# Patient Record
Sex: Female | Born: 1955
Health system: Southern US, Community
[De-identification: ages and names within clinical notes are randomized; demographics above are authoritative.]

## PROBLEM LIST (undated history)

## (undated) DIAGNOSIS — K635 Polyp of colon: Secondary | ICD-10-CM

## (undated) DIAGNOSIS — E079 Disorder of thyroid, unspecified: Secondary | ICD-10-CM

## (undated) DIAGNOSIS — E785 Hyperlipidemia, unspecified: Secondary | ICD-10-CM

## (undated) DIAGNOSIS — R42 Dizziness and giddiness: Secondary | ICD-10-CM

## (undated) DIAGNOSIS — I499 Cardiac arrhythmia, unspecified: Secondary | ICD-10-CM

## (undated) DIAGNOSIS — E039 Hypothyroidism, unspecified: Secondary | ICD-10-CM

## (undated) DIAGNOSIS — R7303 Prediabetes: Secondary | ICD-10-CM

## (undated) HISTORY — DX: Prediabetes: R73.03

## (undated) HISTORY — DX: Polyp of colon: K63.5

## (undated) HISTORY — DX: Hyperlipidemia, unspecified: E78.5

## (undated) HISTORY — DX: Disorder of thyroid, unspecified: E07.9

## (undated) HISTORY — DX: Dizziness and giddiness: R42

## (undated) HISTORY — PX: ABDOMINAL HYSTERECTOMY: SHX81

---

## 2002-01-01 ENCOUNTER — Other Ambulatory Visit: Admission: RE | Admit: 2002-01-01 | Discharge: 2002-01-01 | Payer: Self-pay | Admitting: Obstetrics and Gynecology

## 2003-01-21 ENCOUNTER — Ambulatory Visit (HOSPITAL_BASED_OUTPATIENT_CLINIC_OR_DEPARTMENT_OTHER): Admission: RE | Admit: 2003-01-21 | Discharge: 2003-01-21 | Payer: Self-pay | Admitting: Plastic Surgery

## 2003-02-18 ENCOUNTER — Other Ambulatory Visit: Admission: RE | Admit: 2003-02-18 | Discharge: 2003-02-18 | Payer: Self-pay | Admitting: Obstetrics and Gynecology

## 2008-11-19 LAB — HM COLONOSCOPY: HM COLON: NORMAL

## 2012-06-11 ENCOUNTER — Other Ambulatory Visit: Payer: Self-pay | Admitting: Family Medicine

## 2012-06-11 DIAGNOSIS — R519 Headache, unspecified: Secondary | ICD-10-CM

## 2012-06-11 DIAGNOSIS — R42 Dizziness and giddiness: Secondary | ICD-10-CM

## 2012-06-13 ENCOUNTER — Other Ambulatory Visit: Payer: Self-pay

## 2012-06-18 ENCOUNTER — Ambulatory Visit
Admission: RE | Admit: 2012-06-18 | Discharge: 2012-06-18 | Disposition: A | Discharge status: Home / Self Care | Payer: BC Managed Care – PPO | Source: Ambulatory Visit | Attending: Family Medicine | Admitting: Family Medicine

## 2012-06-18 DIAGNOSIS — R42 Dizziness and giddiness: Secondary | ICD-10-CM

## 2012-06-18 DIAGNOSIS — R519 Headache, unspecified: Secondary | ICD-10-CM

## 2012-06-18 MED ORDER — GADOBENATE DIMEGLUMINE 529 MG/ML IV SOLN
18.0000 mL | Freq: Once | INTRAVENOUS | Status: AC | PRN
  Administered 2012-06-18: 18 mL via INTRAVENOUS

## 2012-11-07 ENCOUNTER — Ambulatory Visit: Payer: Self-pay | Admitting: Family Medicine

## 2012-11-10 ENCOUNTER — Ambulatory Visit
Admission: RE | Admit: 2012-11-10 | Discharge: 2012-11-10 | Disposition: A | Discharge status: Home / Self Care | Payer: BC Managed Care – PPO | Source: Ambulatory Visit | Attending: Family Medicine | Admitting: Family Medicine

## 2012-11-10 ENCOUNTER — Encounter: Payer: Self-pay | Admitting: Family Medicine

## 2012-11-10 ENCOUNTER — Ambulatory Visit (INDEPENDENT_AMBULATORY_CARE_PROVIDER_SITE_OTHER): Payer: BC Managed Care – PPO | Admitting: Family Medicine

## 2012-11-10 ENCOUNTER — Other Ambulatory Visit: Payer: Self-pay | Admitting: Family Medicine

## 2012-11-10 VITALS — BP 128/76 | HR 62 | Temp 98.5°F | Resp 14 | Wt 206.0 lb

## 2012-11-10 DIAGNOSIS — R7303 Prediabetes: Secondary | ICD-10-CM | POA: Insufficient documentation

## 2012-11-10 DIAGNOSIS — E038 Other specified hypothyroidism: Secondary | ICD-10-CM

## 2012-11-10 DIAGNOSIS — M545 Low back pain, unspecified: Secondary | ICD-10-CM

## 2012-11-10 DIAGNOSIS — R002 Palpitations: Secondary | ICD-10-CM

## 2012-11-10 DIAGNOSIS — E039 Hypothyroidism, unspecified: Secondary | ICD-10-CM

## 2012-11-10 DIAGNOSIS — E785 Hyperlipidemia, unspecified: Secondary | ICD-10-CM | POA: Insufficient documentation

## 2012-11-10 DIAGNOSIS — D219 Benign neoplasm of connective and other soft tissue, unspecified: Secondary | ICD-10-CM

## 2012-11-10 DIAGNOSIS — R7989 Other specified abnormal findings of blood chemistry: Secondary | ICD-10-CM

## 2012-11-10 DIAGNOSIS — K635 Polyp of colon: Secondary | ICD-10-CM | POA: Insufficient documentation

## 2012-11-10 LAB — CBC WITH DIFFERENTIAL/PLATELET
Basophils Absolute: 0.1 10*3/uL (ref 0.0–0.1)
Basophils Relative: 1 % (ref 0–1)
Eosinophils Absolute: 0.2 10*3/uL (ref 0.0–0.7)
Eosinophils Relative: 3 % (ref 0–5)
Lymphocytes Relative: 34 % (ref 12–46)
MCHC: 33.8 g/dL (ref 30.0–36.0)
MCV: 92.2 fL (ref 78.0–100.0)
Platelets: 262 10*3/uL (ref 150–400)
RDW: 13.5 % (ref 11.5–15.5)
WBC: 7.6 10*3/uL (ref 4.0–10.5)

## 2012-11-10 LAB — HEPATIC FUNCTION PANEL
Albumin: 4.4 g/dL (ref 3.5–5.2)
Indirect Bilirubin: 0.4 mg/dL (ref 0.0–0.9)
Total Protein: 6.6 g/dL (ref 6.0–8.3)

## 2012-11-10 LAB — TSH: TSH: 7.44 u[IU]/mL — ABNORMAL HIGH (ref 0.350–4.500)

## 2012-11-10 LAB — BASIC METABOLIC PANEL
BUN: 15 mg/dL (ref 6–23)
Glucose, Bld: 107 mg/dL — ABNORMAL HIGH (ref 70–99)
Potassium: 4.6 mEq/L (ref 3.5–5.3)

## 2012-11-10 NOTE — Progress Notes (Signed)
Subjective:     Patient ID: Latasha Lindsey, female   DOB: 1955/10/25, 57 y.o.   MRN: [id number]  HPI #1 polyp-patient reports six-week history of a bump inside her left cheek. She does not remember biting her cheek. She denies any growth in the bone. It is nontender. There is no bleeding or scale.  Does have a history of smoking.  #2 palpitations-she has had a chronic long-standing problem with episodic heart palpitations. She states these can last several seconds. They can sometimes become so fast she feels lightheaded. She denies any syncope. She had the chest pain or shortness of breath.  3 low back pain-this is been going on for several months. It is located at the level of L1-L2. The patient describes occasional spasms of pain and will grab her and slowly release. She denies any sciatica. She denies any symptoms of cauda equina syndrome..   Past Medical History  Diagnosis Date  . Colon polyp   . Prediabetes   . Hyperlipidemia    She is on no meds. She has no drug allergies History   Social History  . Marital Status: Married    Spouse Name: N/A    Number of Children: N/A  . Years of Education: N/A   Occupational History  . Not on file.   Social History Main Topics  . Smoking status: Former Games developer  . Smokeless tobacco: Not on file  . Alcohol Use: Not on file  . Drug Use: Not on file  . Sexually Active: Not on file   Other Topics Concern  . Not on file   Social History Narrative  . No narrative on file      Review of Systems He is systems is otherwise negative    Objective:   Physical Exam  Constitutional: She is oriented to person, place, and time.  HENT:  Head: Normocephalic.  Right Ear: External ear normal.  Left Ear: External ear normal.  Mouth/Throat: Oropharynx is clear and moist.  Eyes: EOM are normal. Pupils are equal, round, and reactive to light.  Neck: Normal range of motion. Neck supple. No thyromegaly present.  Cardiovascular: Normal rate,  regular rhythm and normal heart sounds.  Exam reveals no gallop.   No murmur heard. Pulmonary/Chest: Effort normal and breath sounds normal.  Abdominal: Soft. Bowel sounds are normal.  Musculoskeletal: Normal range of motion. She exhibits tenderness. She exhibits no edema.  Neurological: She is alert and oriented to person, place, and time.   She has a 3 mm polyp in her left cheek.  It is covered with normal healthy mucosa. It is not firm or tender or calcified. There is no scale. There is no ulceration. It appears to be a posttraumatic polyp from a previous bite. She also has tenderness to palpation around her lumbar paraspinal muscles at the level of L1-L3.    Assessment:     Low back pain Palpitations Posttraumatic polyp    Plan:     Obtained lumbosacral x-rays to rule out bony pathology. If x-rays are negative we'll consult physical therapy for chronic musculoskeletal strain.  Reassured patient regarding the polyp. I did recommend she see a dentist just to make sure but I don't think his cancerous.  Palpitations. We'll obtain a CBC CMP and TSH. We'll schedule the patient for one month cardiac event monitor.

## 2012-11-11 LAB — T4, FREE: Free T4: 0.98 ng/dL (ref 0.80–1.80)

## 2012-11-11 MED ORDER — LEVOTHYROXINE SODIUM 50 MCG PO TABS: 50.0000 ug | ORAL_TABLET | Freq: Every day | ORAL | Status: DC

## 2012-11-11 NOTE — Addendum Note (Signed)
Addended by: Lynnea Ferrier T on: 11/11/2012 05:14 PM   Modules accepted: Orders

## 2012-11-11 NOTE — Addendum Note (Signed)
Addended by: WRAY, [place] on: 11/11/2012 11:29 AM   Modules accepted: Orders

## 2012-11-11 NOTE — Addendum Note (Signed)
Addended by: Lynnea Ferrier T on: 11/11/2012 07:14 AM   Modules accepted: Orders

## 2012-11-14 NOTE — Progress Notes (Signed)
Pt aware.

## 2013-01-12 ENCOUNTER — Other Ambulatory Visit (INDEPENDENT_AMBULATORY_CARE_PROVIDER_SITE_OTHER): Payer: BC Managed Care – PPO

## 2013-01-12 DIAGNOSIS — R7989 Other specified abnormal findings of blood chemistry: Secondary | ICD-10-CM

## 2013-01-12 DIAGNOSIS — E038 Other specified hypothyroidism: Secondary | ICD-10-CM

## 2013-01-12 DIAGNOSIS — E039 Hypothyroidism, unspecified: Secondary | ICD-10-CM

## 2013-01-12 LAB — TSH: TSH: 4.822 u[IU]/mL — ABNORMAL HIGH (ref 0.350–4.500)

## 2013-01-15 MED ORDER — LEVOTHYROXINE SODIUM 50 MCG PO TABS: 75.0000 ug | ORAL_TABLET | Freq: Every day | ORAL | Status: DC

## 2013-02-18 ENCOUNTER — Other Ambulatory Visit: Payer: Self-pay | Admitting: Family Medicine

## 2013-02-18 MED ORDER — LEVOTHYROXINE SODIUM 75 MCG PO TABS: 75.0000 ug | ORAL_TABLET | Freq: Every day | ORAL | Status: DC

## 2013-02-18 NOTE — Telephone Encounter (Signed)
medrefilled

## 2013-03-17 ENCOUNTER — Other Ambulatory Visit: Payer: BC Managed Care – PPO

## 2013-03-17 DIAGNOSIS — R7989 Other specified abnormal findings of blood chemistry: Secondary | ICD-10-CM

## 2013-03-17 LAB — TSH: TSH: 0.956 u[IU]/mL (ref 0.350–4.500)

## 2013-05-05 ENCOUNTER — Ambulatory Visit (INDEPENDENT_AMBULATORY_CARE_PROVIDER_SITE_OTHER): Payer: BC Managed Care – PPO | Admitting: Family Medicine

## 2013-05-05 DIAGNOSIS — Z23 Encounter for immunization: Secondary | ICD-10-CM

## 2013-06-17 ENCOUNTER — Other Ambulatory Visit: Payer: Self-pay | Admitting: Family Medicine

## 2013-06-17 ENCOUNTER — Encounter: Payer: Self-pay | Admitting: Physician Assistant

## 2013-06-17 ENCOUNTER — Ambulatory Visit (INDEPENDENT_AMBULATORY_CARE_PROVIDER_SITE_OTHER): Payer: BC Managed Care – PPO | Admitting: Physician Assistant

## 2013-06-17 VITALS — BP 134/90 | HR 80 | Temp 98.6°F | Resp 20 | Wt 200.0 lb

## 2013-06-17 DIAGNOSIS — J209 Acute bronchitis, unspecified: Secondary | ICD-10-CM

## 2013-06-17 DIAGNOSIS — J22 Unspecified acute lower respiratory infection: Secondary | ICD-10-CM

## 2013-06-17 DIAGNOSIS — A499 Bacterial infection, unspecified: Secondary | ICD-10-CM

## 2013-06-17 DIAGNOSIS — J988 Other specified respiratory disorders: Secondary | ICD-10-CM

## 2013-06-17 MED ORDER — LEVOTHYROXINE SODIUM 75 MCG PO TABS: 75.0000 ug | ORAL_TABLET | Freq: Every day | ORAL | Status: DC

## 2013-06-17 MED ORDER — PREDNISONE 20 MG PO TABS: ORAL_TABLET | ORAL | Status: DC

## 2013-06-17 MED ORDER — AZITHROMYCIN 250 MG PO TABS: ORAL_TABLET | ORAL | Status: DC

## 2013-06-17 MED ORDER — ALBUTEROL SULFATE HFA 108 (90 BASE) MCG/ACT IN AERS: 2.0000 | INHALATION_SPRAY | Freq: Four times a day (QID) | RESPIRATORY_TRACT | Status: DC | PRN

## 2013-06-17 NOTE — Progress Notes (Signed)
Patient ID: [name] MRN: [id number], DOB: Jun 13, 1956, 57 y.o. Date of Encounter: 06/17/2013, 4:05 PM    Chief Complaint:  Chief Complaint  Patient presents with  . c/o 8 days chest cold, congestion, wheezing    unusual rash came and gone     HPI: 57 y.o. year old white female she's been sick for about 8 days. Says that she has had very little head or nasal congestion. She may have had a little bit of runny nose for about one day but that was it. Otherwise it is all and chest congestion. Says the most part she is unable to get the phlegm out. When she does get out any phlegm it is a very small amount of extremely thick. She is using Mucinex and Tylenol cold and flu. No sore throat or ear pain. No fevers or chills. She quit smoking 4 years ago. Visit she has no albuterol he orders her other prescription medications for her breathing. She usually does not have much problems with bronchitis or breathing problems.     Home Meds: See attached medication section for any medications that were entered at today's visit. The computer does not put those onto this list.The following list is a list of meds entered prior to today's visit.   No current outpatient prescriptions on file prior to visit.   No current facility-administered medications on file prior to visit.    Allergies: No Known Allergies    Review of Systems: See HPI for pertinent ROS. All other ROS negative.    Physical Exam: Blood pressure 134/90, pulse 80, temperature 98.6 F (37 C), temperature source Oral, resp. rate 20, weight 200 lb (90.719 kg), SpO2 94.00%., There is no height on file to calculate BMI. General: Well-nourished well-developed white female appears in no distress. Does not appear dyspneic. Appears in no acute distress. HEENT: Normocephalic, atraumatic, eyes without discharge, sclera non-icteric, nares are without discharge. Bilateral auditory canals clear, TM's are without perforation, pearly grey and  translucent with reflective cone of light bilaterally. Oral cavity moist, posterior pharynx without exudate, erythema, peritonsillar abscess, or post nasal drip.  Neck: Supple. No thyromegaly. No lymphadenopathy. Lungs: She has very mild wheezes at the upper aspect of her lungs bilaterally. She has a slight increased amount of wheeze at the right base. She has significant increased amount of wheeze and harsh wheeze at the left base. Heart: Regular rhythm. No murmurs, rubs, or gallops. Msk:  Strength and tone normal for age. Extremities/Skin: Warm and dry. No clubbing or cyanosis. No edema. No rashes or suspicious lesions. Neuro: Alert and oriented X 3. Moves all extremities spontaneously. Gait is normal. CNII-XII grossly in tact. Psych:  Responds to questions appropriately with a normal affect.     ASSESSMENT AND PLAN:  57 y.o. year old female with  1. Bacterial lower respiratory infection  - predniSONE (DELTASONE) 20 MG tablet; Take 3 daily for 2 days, then 2 daily for 2 days, then 1 daily for 2 days.  Dispense: 12 tablet; Refill: 0 - azithromycin (ZITHROMAX) 250 MG tablet; Day 1: Take 2 daily.  Days 2-5: Take 1 daily.  Dispense: 6 tablet; Refill: 0 - albuterol (PROVENTIL HFA;VENTOLIN HFA) 108 (90 BASE) MCG/ACT inhaler; Inhale 2 puffs into the lungs every 6 (six) hours as needed for wheezing or shortness of breath.  Dispense: 1 Inhaler; Refill: 0  2. Acute bronchitis  - predniSONE (DELTASONE) 20 MG tablet; Take 3 daily for 2 days, then 2 daily for 2 days, then  1 daily for 2 days.  Dispense: 12 tablet; Refill: 0 - azithromycin (ZITHROMAX) 250 MG tablet; Day 1: Take 2 daily.  Days 2-5: Take 1 daily.  Dispense: 6 tablet; Refill: 0 - albuterol (PROVENTIL HFA;VENTOLIN HFA) 108 (90 BASE) MCG/ACT inhaler; Inhale 2 puffs into the lungs every 6 (six) hours as needed for wheezing or shortness of breath.  Dispense: 1 Inhaler; Refill: 0  Followup if symptoms worsen or if they do not resolve after  completion of the medication.  [address] [place], [place], [facility] 06/17/2013 4:05 PM

## 2013-06-18 ENCOUNTER — Ambulatory Visit: Payer: BC Managed Care – PPO | Admitting: Physician Assistant

## 2013-06-18 ENCOUNTER — Telehealth: Payer: Self-pay | Admitting: Family Medicine

## 2013-06-18 NOTE — Telephone Encounter (Signed)
MBS saw pt yesterday

## 2013-06-18 NOTE — Telephone Encounter (Signed)
Kim: I Reviewed my note from yesterday. I evaluated and treated her for bronchitis yesterday.  I do remember her mentioning that she had had a rash but the rash had resolved. Therefore, that was the end of discussion about the rash. We Did not discuss it any further and there was no rash there to examine.  Kim: Will you please call the patient and find out where the rash is located as far as what body parts are affected.  Find out if the rash is itchy or painful. Find out if it is raised or whether it is just flat and smooth.

## 2013-06-18 NOTE — Telephone Encounter (Signed)
Same rash appeared again this morning then within in hour was gone. On left shoulder and upper arm and abdomen.  Red raised vary in size, then just goes away.  Very bizarre.  Told patient the only way to assess was to see it.  Needs to come to office while has the rash if possible.  Can not correlate anything she is doing or using or eating that would cause this reaction

## 2013-06-18 NOTE — Telephone Encounter (Signed)
Patient made an appointment yesterday to be seen for a rash. By the time she arrived at the appointment it had gone . Now it is back . Please call and advise.

## 2013-06-19 ENCOUNTER — Telehealth: Payer: Self-pay | Admitting: Family Medicine

## 2013-06-19 NOTE — Telephone Encounter (Signed)
Patient came to office.  For the third day in row has erupted with unusual rash on body.  First day was on chest and abdomen.  Second day on upper arms, shoulders and abdomen.  Today she came to office while having rash. It was raised irregular hives all over lower legs.  You could see it starting to fade while looking at it.  Pt stated when she left her house it was much worse.  As she drove here could also see it starting to fade.  It does not itch.  She can not think of any triggers. It seems to only happen in the morning and then not at all the rest of the day. ??? FYI to provider

## 2013-09-22 ENCOUNTER — Other Ambulatory Visit: Payer: BC Managed Care – PPO

## 2013-09-25 ENCOUNTER — Other Ambulatory Visit: Payer: BC Managed Care – PPO

## 2013-10-01 ENCOUNTER — Ambulatory Visit: Payer: BC Managed Care – PPO | Admitting: Family Medicine

## 2013-10-06 ENCOUNTER — Ambulatory Visit: Payer: BC Managed Care – PPO | Admitting: Family Medicine

## 2013-10-13 ENCOUNTER — Encounter: Payer: Self-pay | Admitting: Family Medicine

## 2013-10-13 ENCOUNTER — Other Ambulatory Visit: Payer: Self-pay | Admitting: Family Medicine

## 2013-10-13 ENCOUNTER — Ambulatory Visit (INDEPENDENT_AMBULATORY_CARE_PROVIDER_SITE_OTHER): Payer: 59 | Admitting: Family Medicine

## 2013-10-13 VITALS — BP 128/88 | HR 60 | Temp 97.2°F | Resp 18 | Ht 62.75 in | Wt 191.0 lb

## 2013-10-13 DIAGNOSIS — Z13220 Encounter for screening for lipoid disorders: Secondary | ICD-10-CM

## 2013-10-13 DIAGNOSIS — E039 Hypothyroidism, unspecified: Secondary | ICD-10-CM

## 2013-10-13 LAB — CBC WITH DIFFERENTIAL/PLATELET
Basophils Absolute: 0.1 10*3/uL (ref 0.0–0.1)
Basophils Relative: 1 % (ref 0–1)
EOS ABS: 0.2 10*3/uL (ref 0.0–0.7)
Eosinophils Relative: 3 % (ref 0–5)
HCT: 45.5 % (ref 36.0–46.0)
HEMOGLOBIN: 15.3 g/dL — AB (ref 12.0–15.0)
LYMPHS ABS: 2.7 10*3/uL (ref 0.7–4.0)
LYMPHS PCT: 34 % (ref 12–46)
MCH: 32 pg (ref 26.0–34.0)
MCHC: 33.6 g/dL (ref 30.0–36.0)
MCV: 95.2 fL (ref 78.0–100.0)
MONOS PCT: 8 % (ref 3–12)
Monocytes Absolute: 0.6 10*3/uL (ref 0.1–1.0)
NEUTROS ABS: 4.3 10*3/uL (ref 1.7–7.7)
NEUTROS PCT: 54 % (ref 43–77)
PLATELETS: 239 10*3/uL (ref 150–400)
RBC: 4.78 MIL/uL (ref 3.87–5.11)
RDW: 13 % (ref 11.5–15.5)
WBC: 8 10*3/uL (ref 4.0–10.5)

## 2013-10-13 LAB — COMPLETE METABOLIC PANEL WITH GFR
ALBUMIN: 4.5 g/dL (ref 3.5–5.2)
ALT: 21 U/L (ref 0–35)
AST: 18 U/L (ref 0–37)
Alkaline Phosphatase: 59 U/L (ref 39–117)
BUN: 21 mg/dL (ref 6–23)
CALCIUM: 9.6 mg/dL (ref 8.4–10.5)
CHLORIDE: 104 meq/L (ref 96–112)
CO2: 27 mEq/L (ref 19–32)
Creat: 0.94 mg/dL (ref 0.50–1.10)
GFR, EST NON AFRICAN AMERICAN: 69 mL/min
GFR, Est African American: 79 mL/min
GLUCOSE: 122 mg/dL — AB (ref 70–99)
POTASSIUM: 4.8 meq/L (ref 3.5–5.3)
SODIUM: 141 meq/L (ref 135–145)
TOTAL PROTEIN: 7.1 g/dL (ref 6.0–8.3)
Total Bilirubin: 0.5 mg/dL (ref 0.2–1.2)

## 2013-10-13 LAB — LIPID PANEL
CHOL/HDL RATIO: 5.2 ratio
CHOLESTEROL: 241 mg/dL — AB (ref 0–200)
HDL: 46 mg/dL (ref >39)
LDL Cholesterol: 163 mg/dL — ABNORMAL HIGH (ref 0–99)
Triglycerides: 161 mg/dL — ABNORMAL HIGH (ref <150)
VLDL: 32 mg/dL (ref 0–40)

## 2013-10-13 LAB — TSH: TSH: 2.194 u[IU]/mL (ref 0.350–4.500)

## 2013-10-13 NOTE — Progress Notes (Signed)
   Subjective:    Patient ID: Latasha Lindsey, female    DOB: 1956/05/11, 58 y.o.   MRN: [id number]  HPI This is a very pleasant 58 year old white female who has a history of hypothyroidism, hyperlipidemia, and prediabetes. She is overdue for fasting lab work. Otherwise she is feeling well. She gets her Pap smear, mammogram, and pelvic exam performed at her gynecologist. Her colonoscopy was performed 5 years ago and was normal. The remainder preventative care is up to date. She has no concerns today. Remainder of her review of systems is negative. Past Medical History  Diagnosis Date  . Colon polyp   . Prediabetes   . Hyperlipidemia    Current Outpatient Prescriptions on File Prior to Visit  Medication Sig Dispense Refill  . levothyroxine (SYNTHROID, LEVOTHROID) 75 MCG tablet Take 1 tablet (75 mcg total) by mouth daily before breakfast.  90 tablet  1  . albuterol (PROVENTIL HFA;VENTOLIN HFA) 108 (90 BASE) MCG/ACT inhaler Inhale 2 puffs into the lungs every 6 (six) hours as needed for wheezing or shortness of breath.  1 Inhaler  0   No current facility-administered medications on file prior to visit.   No Known Allergies History   Social History  . Marital Status: Married    Spouse Name: N/A    Number of Children: N/A  . Years of Education: N/A   Occupational History  . Not on file.   Social History Main Topics  . Smoking status: Former Smoker    Quit date: 10/13/2009  . Smokeless tobacco: Never Used  . Alcohol Use: 0.5 oz/week    1 drink(s) per week  . Drug Use: No  . Sexual Activity: Not on file   Other Topics Concern  . Not on file   Social History Narrative  . No narrative on file      Review of Systems  All other systems reviewed and are negative.       Objective:   Physical Exam  Vitals reviewed. Constitutional: She is oriented to person, place, and time. She appears well-developed and well-nourished. No distress.  HENT:  Nose: Nose normal.    Mouth/Throat: Oropharynx is clear and moist.  Eyes: Conjunctivae and EOM are normal.  Neck: Neck supple. No thyromegaly present.  Cardiovascular: Normal rate, regular rhythm and normal heart sounds.  Exam reveals no gallop and no friction rub.   No murmur heard. Pulmonary/Chest: Effort normal and breath sounds normal. No respiratory distress. She has no wheezes. She has no rales.  Abdominal: Soft. Bowel sounds are normal. She exhibits no distension. There is no tenderness. There is no rebound and no guarding.  Musculoskeletal: She exhibits no edema.  Neurological: She is alert and oriented to person, place, and time. She has normal reflexes. She displays normal reflexes. No cranial nerve deficit. She exhibits normal muscle tone. Coordination normal.  Skin: She is not diaphoretic.          Assessment & Plan:  1. Screening cholesterol level Has a history of hyperlipidemia.  Therefore, I will obtain a CMP and a fasting lipid panel. This will also provide me with a fasting blood sugar to assess her prediabetes - COMPLETE METABOLIC PANEL WITH GFR - Lipid panel  2. Unspecified hypothyroidism Check a TSH. I will titrate her levothyroxine to achieve a TSH within the therapeutic range. - CBC with Differential - TSH

## 2013-10-16 LAB — HEMOGLOBIN A1C
HEMOGLOBIN A1C: 6.1 % — AB (ref <5.7)
MEAN PLASMA GLUCOSE: 128 mg/dL — AB (ref <117)

## 2013-10-21 ENCOUNTER — Other Ambulatory Visit: Payer: Self-pay | Admitting: Family Medicine

## 2013-10-21 MED ORDER — LEVOTHYROXINE SODIUM 75 MCG PO TABS: 75.0000 ug | ORAL_TABLET | Freq: Every day | ORAL | Status: DC

## 2013-11-08 ENCOUNTER — Encounter: Payer: Self-pay | Admitting: Family Medicine

## 2014-01-12 ENCOUNTER — Encounter: Payer: Self-pay | Admitting: Family Medicine

## 2014-01-12 ENCOUNTER — Ambulatory Visit (INDEPENDENT_AMBULATORY_CARE_PROVIDER_SITE_OTHER): Payer: 59 | Admitting: Family Medicine

## 2014-01-12 VITALS — BP 126/74 | HR 64 | Temp 97.7°F | Resp 14 | Ht 63.0 in | Wt 187.0 lb

## 2014-01-12 DIAGNOSIS — J019 Acute sinusitis, unspecified: Secondary | ICD-10-CM

## 2014-01-12 DIAGNOSIS — M25569 Pain in unspecified knee: Secondary | ICD-10-CM

## 2014-01-12 DIAGNOSIS — M25561 Pain in right knee: Secondary | ICD-10-CM

## 2014-01-12 MED ORDER — FLUTICASONE PROPIONATE 50 MCG/ACT NA SUSP: 2.0000 | Freq: Every day | NASAL | Status: DC

## 2014-01-12 MED ORDER — AMOXICILLIN 875 MG PO TABS: 875.0000 mg | ORAL_TABLET | Freq: Two times a day (BID) | ORAL | Status: DC

## 2014-01-12 NOTE — Progress Notes (Signed)
Subjective:    Patient ID: Latasha Lindsey, female    DOB: 03/17/1956, 58 y.o.   MRN: [id number]  HPI Patient reports 3 days of postnasal drip, sinus pressure, rhinorrhea, yellow nasal discharge, cough productive of yellow mucus.  She denies any sinus pain. She denies any fevers. She denies chills. She does have a dull headache. Her vertigo is also worsened. Also, 2 weeks ago, the patient twisted her right knee at work.  She now has medial knee pain and posterior knee pain particularly with rotational motion on the knee.  It is slowly improving. Past Medical History  Diagnosis Date  . Colon polyp   . Prediabetes   . Hyperlipidemia    Current Outpatient Prescriptions on File Prior to Visit  Medication Sig Dispense Refill  . albuterol (PROVENTIL HFA;VENTOLIN HFA) 108 (90 BASE) MCG/ACT inhaler Inhale 2 puffs into the lungs every 6 (six) hours as needed for wheezing or shortness of breath.  1 Inhaler  0  . aspirin 81 MG tablet Take 81 mg by mouth daily.      Marland Kitchen FISH OIL-KRILL OIL PO Take 1 capsule by mouth daily.      Marland Kitchen levothyroxine (SYNTHROID, LEVOTHROID) 75 MCG tablet Take 1 tablet (75 mcg total) by mouth daily before breakfast.  90 tablet  4   No current facility-administered medications on file prior to visit.   No Known Allergies History   Social History  . Marital Status: Married    Spouse Name: N/A    Number of Children: N/A  . Years of Education: N/A   Occupational History  . Not on file.   Social History Main Topics  . Smoking status: Former Smoker    Quit date: 10/13/2009  . Smokeless tobacco: Never Used  . Alcohol Use: 0.5 oz/week    1 drink(s) per week  . Drug Use: No  . Sexual Activity: Not on file   Other Topics Concern  . Not on file   Social History Narrative  . No narrative on file      Review of Systems  All other systems reviewed and are negative.      Objective:   Physical Exam  Vitals reviewed. HENT:  Right Ear: Tympanic membrane, external  ear and ear canal normal.  Left Ear: Tympanic membrane, external ear and ear canal normal.  Nose: Mucosal edema and rhinorrhea present.  Mouth/Throat: Oropharynx is clear and moist. No oropharyngeal exudate.  Eyes: Conjunctivae are normal.  Neck: Neck supple.  Cardiovascular: Normal rate, regular rhythm and normal heart sounds.   No murmur heard. Pulmonary/Chest: Effort normal and breath sounds normal. No respiratory distress. She has no wheezes. She has no rales.  Musculoskeletal:       Right knee: She exhibits abnormal meniscus. She exhibits normal range of motion, no swelling, no effusion, no erythema, no LCL laxity and normal patellar mobility. Tenderness found. Medial joint line tenderness noted. No lateral joint line, no MCL and no LCL tenderness noted.  Lymphadenopathy:    She has no cervical adenopathy.          Assessment & Plan:  1. Acute rhinosinusitis I believe the patient has allergic rhinosinusitis. I recommended Flonase 2 sprays each nostril daily. If symptoms are not improving after one week or if she develops a fever and headache, I recommended she start taking amoxicillin 875 mg by mouth twice a day for 10 days - fluticasone (FLONASE) 50 MCG/ACT nasal spray; Place 2 sprays into both nostrils daily.  Dispense:  16 g; Refill: 6 - amoxicillin (AMOXIL) 875 MG tablet; Take 1 tablet (875 mg total) by mouth 2 (two) times daily.  Dispense: 20 tablet; Refill: 0  2. Right knee pain I suspect a medial meniscal tear versus a sprain of knee. I recommended rest ice and elevation. I recommended ibuprofen as needed. If symptoms are not improving after 2 weeks I recommend cortisone injection. His symptoms did not improve with cortisone injection I recommend an MRI

## 2014-04-27 ENCOUNTER — Ambulatory Visit (INDEPENDENT_AMBULATORY_CARE_PROVIDER_SITE_OTHER): Payer: 59 | Admitting: *Deleted

## 2014-04-27 DIAGNOSIS — Z23 Encounter for immunization: Secondary | ICD-10-CM

## 2014-12-15 ENCOUNTER — Ambulatory Visit (INDEPENDENT_AMBULATORY_CARE_PROVIDER_SITE_OTHER): Payer: 59 | Admitting: Family Medicine

## 2014-12-15 ENCOUNTER — Encounter: Payer: Self-pay | Admitting: Family Medicine

## 2014-12-15 VITALS — BP 138/84 | Ht 63.0 in | Wt 174.0 lb

## 2014-12-15 DIAGNOSIS — E785 Hyperlipidemia, unspecified: Secondary | ICD-10-CM

## 2014-12-15 DIAGNOSIS — M25512 Pain in left shoulder: Secondary | ICD-10-CM | POA: Diagnosis not present

## 2014-12-15 DIAGNOSIS — Z1231 Encounter for screening mammogram for malignant neoplasm of breast: Secondary | ICD-10-CM | POA: Diagnosis not present

## 2014-12-15 DIAGNOSIS — Z0000 Encounter for general adult medical examination without abnormal findings: Secondary | ICD-10-CM

## 2014-12-15 DIAGNOSIS — Z79899 Other long term (current) drug therapy: Secondary | ICD-10-CM

## 2014-12-15 DIAGNOSIS — E039 Hypothyroidism, unspecified: Secondary | ICD-10-CM | POA: Diagnosis not present

## 2014-12-15 NOTE — Progress Notes (Signed)
   Subjective:    Patient ID: Latasha Lindsey, female    DOB: 1956/02/18, 59 y.o.   MRN: [id number]  HPI The patient comes in today for a wellness visit.  Goes to [employer] day after work,  Works as a school caf [employer]  Diet has improved, watching fat intake  Recently  divorded  Moved out and now feeling better, with improved energy.  On thyr dos A review of their health history was completed.  A review of medications was also completed.  Any needed refills; levothyroxine  Eating habits: health conscious  Falls/  MVA accidents in past few months: none  Regular exercise: [employer] 5 days a week  Specialist pt sees on regular basis: none  Preventative health issues were discussed.   Additional concerns: [place] on left shoulder a few years ago., Landed on shoulder and fell and hurt   Still having pain and trouble with mobility.   Colonoscopy now utd none til 41   Hx of numbness in feet from   Hx of   No fam hx of bf cancer    Review of Systems  Constitutional: Negative for activity change, appetite change and fatigue.  HENT: Negative for congestion, ear discharge and rhinorrhea.   Eyes: Negative for discharge.  Respiratory: Negative for cough, chest tightness and wheezing.   Cardiovascular: Negative for chest pain.  Gastrointestinal: Negative for vomiting and abdominal pain.  Endocrine:       Also known hyperlipidemia. History of elevated glucose  Genitourinary: Negative for frequency and difficulty urinating.  Musculoskeletal: Negative for neck pain.       Chronic shoulder pain left side status post old injury fell a few years ago  Allergic/Immunologic: Negative for environmental allergies and food allergies.  Neurological: Negative for weakness and headaches.  Psychiatric/Behavioral: Negative for behavioral problems and agitation.  All other systems reviewed and are negative.      Objective:   Physical Exam  Constitutional: She is oriented to person,  place, and time. She appears well-developed and well-nourished.  HENT:  Head: Normocephalic.  Right Ear: External ear normal.  Left Ear: External ear normal.  Eyes: Pupils are equal, round, and reactive to light.  Neck: Normal range of motion. No thyromegaly present.  Cardiovascular: Normal rate, regular rhythm, normal heart sounds and intact distal pulses.   No murmur heard. Pulmonary/Chest: Effort normal and breath sounds normal. No respiratory distress. She has no wheezes.  Abdominal: Soft. Bowel sounds are normal. She exhibits no distension and no mass. There is no tenderness.  Genitourinary: Vagina normal.  No adnexal masses. Breasts within normal limits  Musculoskeletal: Normal range of motion. She exhibits no edema or tenderness.  Lymphadenopathy:    She has no cervical adenopathy.  Neurological: She is alert and oriented to person, place, and time. She exhibits normal muscle tone.  Skin: Skin is warm and dry.  Psychiatric: She has a normal mood and affect. Her behavior is normal.  Vitals reviewed.         Assessment & Plan:  Impression 1 well adult exam #2 hypothyroidism #3 chronic shoulder pain #4 impaired fasting glucose plan appropriate blood work. X-ray of involved shoulder. Mammogram encourage. Up-to-date on colonoscopy next 2 in 2 years [facility]

## 2014-12-16 LAB — HEPATIC FUNCTION PANEL
ALT: 14 IU/L (ref 0–32)
AST: 15 IU/L (ref 0–40)
Albumin: 4.4 g/dL (ref 3.5–5.5)
Alkaline Phosphatase: 63 IU/L (ref 39–117)
Bilirubin Total: 0.5 mg/dL (ref 0.0–1.2)
Bilirubin, Direct: 0.14 mg/dL (ref 0.00–0.40)
Total Protein: 6.7 g/dL (ref 6.0–8.5)

## 2014-12-16 LAB — LIPID PANEL
CHOL/HDL RATIO: 4.2 ratio (ref 0.0–4.4)
CHOLESTEROL TOTAL: 220 mg/dL — AB (ref 100–199)
HDL: 52 mg/dL (ref >39)
LDL Calculated: 139 mg/dL — ABNORMAL HIGH (ref 0–99)
TRIGLYCERIDES: 144 mg/dL (ref 0–149)
VLDL Cholesterol Cal: 29 mg/dL (ref 5–40)

## 2014-12-16 LAB — BASIC METABOLIC PANEL
BUN / CREAT RATIO: 20 (ref 9–23)
BUN: 19 mg/dL (ref 6–24)
CO2: 25 mmol/L (ref 18–29)
CREATININE: 0.94 mg/dL (ref 0.57–1.00)
Calcium: 9.6 mg/dL (ref 8.7–10.2)
Chloride: 103 mmol/L (ref 97–108)
GFR, EST AFRICAN AMERICAN: 78 mL/min/{1.73_m2} (ref >59)
GFR, EST NON AFRICAN AMERICAN: 68 mL/min/{1.73_m2} (ref >59)
GLUCOSE: 115 mg/dL — AB (ref 65–99)
Potassium: 5.1 mmol/L (ref 3.5–5.2)
Sodium: 143 mmol/L (ref 134–144)

## 2014-12-16 LAB — TSH: TSH: 1.9 u[IU]/mL (ref 0.450–4.500)

## 2014-12-16 LAB — T4: T4, Total: 9 ug/dL (ref 4.5–12.0)

## 2014-12-17 ENCOUNTER — Other Ambulatory Visit: Payer: Self-pay | Admitting: Family Medicine

## 2014-12-20 ENCOUNTER — Ambulatory Visit (HOSPITAL_COMMUNITY)
Admission: RE | Admit: 2014-12-20 | Discharge: 2014-12-20 | Disposition: A | Discharge status: Home / Self Care | Payer: 59 | Source: Ambulatory Visit | Attending: Family Medicine | Admitting: Family Medicine

## 2014-12-20 ENCOUNTER — Other Ambulatory Visit: Payer: Self-pay | Admitting: *Deleted

## 2014-12-20 DIAGNOSIS — M25512 Pain in left shoulder: Secondary | ICD-10-CM | POA: Insufficient documentation

## 2014-12-20 MED ORDER — LEVOTHYROXINE SODIUM 75 MCG PO TABS: 75.0000 ug | ORAL_TABLET | Freq: Every day | ORAL | Status: DC

## 2015-01-05 ENCOUNTER — Ambulatory Visit: Payer: 59 | Admitting: Family Medicine

## 2015-01-11 ENCOUNTER — Ambulatory Visit (INDEPENDENT_AMBULATORY_CARE_PROVIDER_SITE_OTHER): Payer: 59 | Admitting: Family Medicine

## 2015-01-11 ENCOUNTER — Encounter: Payer: Self-pay | Admitting: Family Medicine

## 2015-01-11 VITALS — BP 112/80 | Ht 63.0 in | Wt 176.4 lb

## 2015-01-11 DIAGNOSIS — R7303 Prediabetes: Secondary | ICD-10-CM

## 2015-01-11 DIAGNOSIS — E039 Hypothyroidism, unspecified: Secondary | ICD-10-CM | POA: Diagnosis not present

## 2015-01-11 DIAGNOSIS — R05 Cough: Secondary | ICD-10-CM | POA: Diagnosis not present

## 2015-01-11 DIAGNOSIS — E785 Hyperlipidemia, unspecified: Secondary | ICD-10-CM | POA: Diagnosis not present

## 2015-01-11 DIAGNOSIS — R7309 Other abnormal glucose: Secondary | ICD-10-CM | POA: Diagnosis not present

## 2015-01-11 DIAGNOSIS — M7542 Impingement syndrome of left shoulder: Secondary | ICD-10-CM | POA: Diagnosis not present

## 2015-01-11 DIAGNOSIS — R053 Chronic cough: Secondary | ICD-10-CM

## 2015-01-11 NOTE — Progress Notes (Signed)
   Subjective:    Patient ID: Latasha Lindsey, female    DOB: July 13, 1956, 59 y.o.   MRN: [id number]  HPI Patient is here today to discuss her recent blood work results. Results are in the system.   Patient states that she has an article on Levothyroxine she would like to show to the doctor.   The article speaks to the potential benefit of Armour thyroid.    history of hyperlipidemia. Trying to watch her diet. Just had blood work in this regard.   History of glucose intolerance. States overall trying to work on sugars. Exercising fairly regularly.  Ongoing shoulder pain left-sided. Status post remote fall. Worse with certain motions.    Results for orders placed or performed in visit on 12/15/14  Lipid panel  Result Value Ref Range   Cholesterol, Total 220 (H) 100 - 199 mg/dL   Triglycerides 144 0 - 149 mg/dL   HDL 52 >39 mg/dL   VLDL Cholesterol Cal 29 5 - 40 mg/dL   LDL Calculated 139 (H) 0 - 99 mg/dL   Chol/HDL Ratio 4.2 0.0 - 4.4 ratio units  Basic metabolic panel  Result Value Ref Range   Glucose 115 (H) 65 - 99 mg/dL   BUN 19 6 - 24 mg/dL   Creatinine, Ser 0.94 0.57 - 1.00 mg/dL   GFR calc non Af Amer 68 >59 mL/min/1.73   GFR calc Af Amer 78 >59 mL/min/1.73   BUN/Creatinine Ratio 20 9 - 23   Sodium 143 134 - 144 mmol/L   Potassium 5.1 3.5 - 5.2 mmol/L   Chloride 103 97 - 108 mmol/L   CO2 25 18 - 29 mmol/L   Calcium 9.6 8.7 - 10.2 mg/dL  Hepatic function panel  Result Value Ref Range   Total Protein 6.7 6.0 - 8.5 g/dL   Albumin 4.4 3.5 - 5.5 g/dL   Bilirubin Total 0.5 0.0 - 1.2 mg/dL   Bilirubin, Direct 0.14 0.00 - 0.40 mg/dL   Alkaline Phosphatase 63 39 - 117 IU/L   AST 15 0 - 40 IU/L   ALT 14 0 - 32 IU/L  TSH  Result Value Ref Range   TSH 1.900 0.450 - 4.500 uIU/mL  T4  Result Value Ref Range   T4, Total 9.0 4.5 - 12.0 ug/dL     Review of Systems  no headache no chest pain no back pain abdominal pain no change in bowel habits    Objective:   Physical  Exam  alert vitals stable H&T normal. Lungs clear heart rare rhythm left shoulder positive impingement sign       Assessment & Plan:   impression 1 hyperlipidemia discussed at length #2 hypothyroidism discussed at length #3 impaired fasting glucose discussed #4 left shoulder impingement plan if shoulder worsens we can set up with orthopedic. Diet discussed at length. Would maintain same thyroid with excellent TSH value and check yearly discussed 25 minutes spent most in discussion [facility]

## 2015-01-17 ENCOUNTER — Ambulatory Visit (HOSPITAL_COMMUNITY)
Admission: RE | Admit: 2015-01-17 | Discharge: 2015-01-17 | Disposition: A | Discharge status: Home / Self Care | Payer: 59 | Source: Ambulatory Visit | Attending: Family Medicine | Admitting: Family Medicine

## 2015-01-17 DIAGNOSIS — Z1231 Encounter for screening mammogram for malignant neoplasm of breast: Secondary | ICD-10-CM | POA: Insufficient documentation

## 2015-01-20 ENCOUNTER — Encounter: Payer: Self-pay | Admitting: Family Medicine

## 2015-03-16 ENCOUNTER — Other Ambulatory Visit: Payer: Self-pay | Admitting: Family Medicine

## 2015-05-25 ENCOUNTER — Ambulatory Visit: Payer: 59

## 2015-06-03 ENCOUNTER — Ambulatory Visit (INDEPENDENT_AMBULATORY_CARE_PROVIDER_SITE_OTHER): Payer: 59

## 2015-06-03 DIAGNOSIS — Z23 Encounter for immunization: Secondary | ICD-10-CM

## 2015-09-12 ENCOUNTER — Telehealth: Payer: Self-pay | Admitting: Family Medicine

## 2015-09-12 MED ORDER — LEVOTHYROXINE SODIUM 75 MCG PO TABS: ORAL_TABLET | ORAL | Status: DC

## 2015-09-12 NOTE — Telephone Encounter (Signed)
levothyroxine (SYNTHROID, LEVOTHROID) 75 MCG tablet  Pt needs refill on this please, [place] on a family emergency If you need her to come in it will have to be next week.   [facility] (she will pick it up when she gets back in town this weekend)

## 2015-09-12 NOTE — Telephone Encounter (Signed)
Left message on voicemail notifying patient that 30 day supply was sent to pharmacy.  

## 2015-10-10 ENCOUNTER — Other Ambulatory Visit: Payer: Self-pay | Admitting: Family Medicine

## 2015-10-12 ENCOUNTER — Telehealth: Payer: Self-pay | Admitting: Family Medicine

## 2015-10-12 DIAGNOSIS — R739 Hyperglycemia, unspecified: Secondary | ICD-10-CM

## 2015-10-12 DIAGNOSIS — E785 Hyperlipidemia, unspecified: Secondary | ICD-10-CM

## 2015-10-12 DIAGNOSIS — E039 Hypothyroidism, unspecified: Secondary | ICD-10-CM

## 2015-10-12 NOTE — Telephone Encounter (Signed)
Pt is requesting lab orders to be sent over for an upcoming appt. Last labs per epic were: lipid,bmp,hepatic,tsh and t4 on 12/15/14

## 2015-10-12 NOTE — Telephone Encounter (Signed)
Rep same 

## 2015-10-12 NOTE — Telephone Encounter (Signed)
Left message informing patient lab were ordered per Dr.Steve Luking. Informed patient nothing to eat or drink after midnight.

## 2015-10-20 LAB — LIPID PANEL
CHOLESTEROL TOTAL: 249 mg/dL — AB (ref 100–199)
Chol/HDL Ratio: 4.9 ratio units — ABNORMAL HIGH (ref 0.0–4.4)
HDL: 51 mg/dL (ref >39)
LDL Calculated: 170 mg/dL — ABNORMAL HIGH (ref 0–99)
Triglycerides: 139 mg/dL (ref 0–149)
VLDL CHOLESTEROL CAL: 28 mg/dL (ref 5–40)

## 2015-10-20 LAB — BASIC METABOLIC PANEL
BUN / CREAT RATIO: 18 (ref 9–23)
BUN: 17 mg/dL (ref 6–24)
CO2: 24 mmol/L (ref 18–29)
CREATININE: 0.94 mg/dL (ref 0.57–1.00)
Calcium: 9.4 mg/dL (ref 8.7–10.2)
Chloride: 101 mmol/L (ref 96–106)
GFR calc Af Amer: 78 mL/min/{1.73_m2} (ref >59)
GFR, EST NON AFRICAN AMERICAN: 68 mL/min/{1.73_m2} (ref >59)
Glucose: 114 mg/dL — ABNORMAL HIGH (ref 65–99)
Potassium: 4.7 mmol/L (ref 3.5–5.2)
SODIUM: 142 mmol/L (ref 134–144)

## 2015-10-20 LAB — TSH: TSH: 1.66 u[IU]/mL (ref 0.450–4.500)

## 2015-10-20 LAB — HEPATIC FUNCTION PANEL
ALBUMIN: 4.6 g/dL (ref 3.5–5.5)
ALK PHOS: 60 IU/L (ref 39–117)
ALT: 20 IU/L (ref 0–32)
AST: 20 IU/L (ref 0–40)
BILIRUBIN, DIRECT: 0.13 mg/dL (ref 0.00–0.40)
Bilirubin Total: 0.3 mg/dL (ref 0.0–1.2)
Total Protein: 6.9 g/dL (ref 6.0–8.5)

## 2015-10-20 LAB — T4: T4 TOTAL: 9.5 ug/dL (ref 4.5–12.0)

## 2015-10-27 ENCOUNTER — Encounter: Payer: Self-pay | Admitting: Family Medicine

## 2015-10-27 ENCOUNTER — Ambulatory Visit (INDEPENDENT_AMBULATORY_CARE_PROVIDER_SITE_OTHER): Payer: BLUE CROSS/BLUE SHIELD | Admitting: Family Medicine

## 2015-10-27 VITALS — BP 122/80 | Ht 63.0 in | Wt 192.0 lb

## 2015-10-27 DIAGNOSIS — R7303 Prediabetes: Secondary | ICD-10-CM

## 2015-10-27 DIAGNOSIS — M7751 Other enthesopathy of right foot: Secondary | ICD-10-CM | POA: Diagnosis not present

## 2015-10-27 DIAGNOSIS — E038 Other specified hypothyroidism: Secondary | ICD-10-CM | POA: Diagnosis not present

## 2015-10-27 DIAGNOSIS — E785 Hyperlipidemia, unspecified: Secondary | ICD-10-CM

## 2015-10-27 MED ORDER — LEVOTHYROXINE SODIUM 75 MCG PO TABS: ORAL_TABLET | ORAL | Status: DC

## 2015-10-27 NOTE — Patient Instructions (Signed)

## 2015-10-27 NOTE — Progress Notes (Signed)
   Subjective:    Patient ID: Latasha Lindsey, female    DOB: 09-Sep-1955, 60 y.o.   MRN: [id number]  HPIMed check up. Hypothyroid. Had bw done on 3/8. Pt would like 90 day supply refills on levothyroxine with one year of refills.   Results for orders placed or performed in visit on 10/12/15  Lipid panel  Result Value Ref Range   Cholesterol, Total 249 (H) 100 - 199 mg/dL   Triglycerides 139 0 - 149 mg/dL   HDL 51 >39 mg/dL   VLDL Cholesterol Cal 28 5 - 40 mg/dL   LDL Calculated 170 (H) 0 - 99 mg/dL   Chol/HDL Ratio 4.9 (H) 0.0 - 4.4 ratio units  Basic metabolic panel  Result Value Ref Range   Glucose 114 (H) 65 - 99 mg/dL   BUN 17 6 - 24 mg/dL   Creatinine, Ser 0.94 0.57 - 1.00 mg/dL   GFR calc non Af Amer 68 >59 mL/min/1.73   GFR calc Af Amer 78 >59 mL/min/1.73   BUN/Creatinine Ratio 18 9 - 23   Sodium 142 134 - 144 mmol/L   Potassium 4.7 3.5 - 5.2 mmol/L   Chloride 101 96 - 106 mmol/L   CO2 24 18 - 29 mmol/L   Calcium 9.4 8.7 - 10.2 mg/dL  Hepatic function panel  Result Value Ref Range   Total Protein 6.9 6.0 - 8.5 g/dL   Albumin 4.6 3.5 - 5.5 g/dL   Bilirubin Total 0.3 0.0 - 1.2 mg/dL   Bilirubin, Direct 0.13 0.00 - 0.40 mg/dL   Alkaline Phosphatase 60 39 - 117 IU/L   AST 20 0 - 40 IU/L   ALT 20 0 - 32 IU/L  TSH  Result Value Ref Range   TSH 1.660 0.450 - 4.500 uIU/mL  T4  Result Value Ref Range   T4, Total 9.5 4.5 - 12.0 ug/dL    Having fatigue. Tiredness. Progressive in recent months. Claims no depression. Just diminished energy.  Bump on right heel. Came up a few months ago.   Patient has known history of hyperlipidemia and impaired fasting glucose. Reports diet efforts have not been the best lately  Review of Systems No headache no chest pain no back pain no abdominal pain no change in bowel habits gradually came    Objective:   Physical Exam Alert obesity present vital stable HEENT normal thyroid nonpalpable lungs clear heart rare rhythm right posterior  heel tenderness swollen bump on calcaneus       Assessment & Plan:  Impression 1 heelbursitis#2 hypothyroidism good control maintain same meds #3 hyperlipidemia worsening control. Patient wishes to hold off on meds for now #4 impaired fasting glucose stable #5 fatigue discuss plan diet exercise discussed. Medications refilled. If LDL does not improve will be considering medicines within the year discussed with patient WSL

## 2016-03-23 ENCOUNTER — Ambulatory Visit (INDEPENDENT_AMBULATORY_CARE_PROVIDER_SITE_OTHER): Payer: BLUE CROSS/BLUE SHIELD | Admitting: Nurse Practitioner

## 2016-03-23 ENCOUNTER — Encounter: Payer: Self-pay | Admitting: Nurse Practitioner

## 2016-03-23 VITALS — BP 130/82 | Temp 98.0°F | Ht 63.0 in | Wt 182.0 lb

## 2016-03-23 DIAGNOSIS — M62838 Other muscle spasm: Secondary | ICD-10-CM

## 2016-03-23 DIAGNOSIS — J300 Vasomotor rhinitis: Secondary | ICD-10-CM

## 2016-03-23 DIAGNOSIS — M6248 Contracture of muscle, other site: Secondary | ICD-10-CM | POA: Diagnosis not present

## 2016-03-23 DIAGNOSIS — H81311 Aural vertigo, right ear: Secondary | ICD-10-CM

## 2016-03-23 DIAGNOSIS — H8391 Unspecified disease of right inner ear: Secondary | ICD-10-CM

## 2016-03-23 NOTE — Patient Instructions (Addendum)
Meclizine 25 mg every 6 hours as needed for dizziness flonase nasal spray

## 2016-03-24 ENCOUNTER — Encounter: Payer: Self-pay | Admitting: Nurse Practitioner

## 2016-03-24 NOTE — Progress Notes (Signed)
Subjective:  Presents for c/o vertigo for about 9 days. Has had same symptoms off/on for 8-10 years. Can last 1 day to one month. Chronic history of motion sickness and carsickness. Vertigo affecting the right side. Cannot sleep on right side since it worsens symptoms. Worse with turning her head. No syncope. Gets a numb, intense sensation going from the right neck into the right scalp area. Has had this intermittently over the years as well. Has been told she has muscle spasms in her neck. Has had MRI in the past due to all of her symptoms. No change in symptomatology. Nausea, no vomiting. No weakness of face, arms or legs. No difficulty speaking or swallowing. No headache.   Objective:   BP 130/82   Temp 98 F (36.7 C) (Oral)   Ht 5\' 3"  (1.6 m)   Wt 182 lb (82.6 kg)   BMI 32.24 kg/m  NAD. Alert, oriented. Cheerful affect. TMs retracted bilat, no erythema. Pharynx mildly injected with cloudy PND noted. Neck supple with mild anterior adenopathy. Lungs clear. Heart RRR. Pupils equal and reactive to light. EOMs intact without nystagmus. Point to point localization normal. Hand strength 5+ bilat. Reflexes normal. Romberg neg. Normal ROM of the neck. Very tight tender muscles right cervical and upper back area, worse along the right lateral neck area into lateral scalp. MRI of the brain dated 06/18/12 showed no acute changes.   Assessment: Inner ear dysfunction, right  Vertigo, aural, right  Vasomotor rhinitis  Muscle spasms of head and/or neck   Plan: Meclizine 25 mg every 6 hours as needed for dizziness flonase nasal spray Massage therapy. Ice/heat applications. Warning signs reviewed. Patient defers steroids at this time. Call back if persists or new symptoms develop.

## 2016-04-11 ENCOUNTER — Encounter: Payer: Self-pay | Admitting: Family Medicine

## 2016-04-11 DIAGNOSIS — E785 Hyperlipidemia, unspecified: Secondary | ICD-10-CM

## 2016-04-11 DIAGNOSIS — Z79899 Other long term (current) drug therapy: Secondary | ICD-10-CM

## 2016-04-11 DIAGNOSIS — R7303 Prediabetes: Secondary | ICD-10-CM

## 2016-04-11 DIAGNOSIS — Z1159 Encounter for screening for other viral diseases: Secondary | ICD-10-CM

## 2016-04-13 LAB — HEPATIC FUNCTION PANEL
ALBUMIN: 4.8 g/dL (ref 3.5–5.5)
ALT: 18 IU/L (ref 0–32)
AST: 23 IU/L (ref 0–40)
Alkaline Phosphatase: 69 IU/L (ref 39–117)
Bilirubin Total: 0.3 mg/dL (ref 0.0–1.2)
Bilirubin, Direct: 0.1 mg/dL (ref 0.00–0.40)
TOTAL PROTEIN: 7.2 g/dL (ref 6.0–8.5)

## 2016-04-13 LAB — LIPID PANEL
CHOL/HDL RATIO: 4.9 ratio — AB (ref 0.0–4.4)
Cholesterol, Total: 253 mg/dL — ABNORMAL HIGH (ref 100–199)
HDL: 52 mg/dL (ref >39)
LDL CALC: 176 mg/dL — AB (ref 0–99)
Triglycerides: 126 mg/dL (ref 0–149)
VLDL CHOLESTEROL CAL: 25 mg/dL (ref 5–40)

## 2016-04-13 LAB — GLUCOSE, RANDOM: GLUCOSE: 116 mg/dL — AB (ref 65–99)

## 2016-04-13 LAB — HEPATITIS C ANTIBODY

## 2016-04-18 ENCOUNTER — Encounter: Payer: Self-pay | Admitting: Family Medicine

## 2016-04-18 ENCOUNTER — Ambulatory Visit (INDEPENDENT_AMBULATORY_CARE_PROVIDER_SITE_OTHER): Payer: BLUE CROSS/BLUE SHIELD | Admitting: Family Medicine

## 2016-04-18 VITALS — BP 130/80 | Ht 63.0 in | Wt 178.2 lb

## 2016-04-18 DIAGNOSIS — Z0189 Encounter for other specified special examinations: Secondary | ICD-10-CM | POA: Diagnosis not present

## 2016-04-18 DIAGNOSIS — Z0000 Encounter for general adult medical examination without abnormal findings: Secondary | ICD-10-CM

## 2016-04-18 DIAGNOSIS — Z23 Encounter for immunization: Secondary | ICD-10-CM

## 2016-04-18 DIAGNOSIS — Z1231 Encounter for screening mammogram for malignant neoplasm of breast: Secondary | ICD-10-CM | POA: Diagnosis not present

## 2016-04-18 DIAGNOSIS — E039 Hypothyroidism, unspecified: Secondary | ICD-10-CM

## 2016-04-18 MED ORDER — LEVOTHYROXINE SODIUM 75 MCG PO TABS: ORAL_TABLET | ORAL | 3 refills | Status: DC

## 2016-04-18 NOTE — Progress Notes (Signed)
Subjective:    Patient ID: Latasha Lindsey, female    DOB: 05-19-1956, 60 y.o.   MRN: [id number]  HPI The patient comes in today for a wellness visit.    A review of their health history was completed.  A review of medications was also completed.  Any needed refills; None  Eating habits: Patient states eating habits are fair. Does not eat much meat besides chicken.   on u pellet  Substance with unknow med involved supplement Falls/  MVA accidents in past few months: None   Regular exercise: Patient states tries to walk 4 miles daily. Specialist pt sees on regular basis: [facility] for Hormone therapy.   Preventative health issues were discussed.   Additional concerns: Patient would like a flu shot and to discuss taking Aspirin daily.   No mammo this yr   utd as of this yr  Flu shot   Baby aspitin daily  Also would like to know about taking two Levothyroxine tablets. Patient also has concerns of right heel pain.   Colon done in t 41 yrs age   cont'd post heel pain, swollen and tender  wit ongoing swellign, wears boots with    Does not miss a dose  Review of Systems  Constitutional: Negative.  Negative for activity change, appetite change and fatigue.  HENT: Negative for congestion, ear discharge and rhinorrhea.   Eyes: Negative for discharge.  Respiratory: Negative for cough, chest tightness and wheezing.   Cardiovascular: Negative for chest pain.  Gastrointestinal: Negative for abdominal pain and vomiting.  Genitourinary: Negative for difficulty urinating and frequency.  Musculoskeletal: Negative for neck pain.  Allergic/Immunologic: Negative for environmental allergies and food allergies.  Neurological: Negative for weakness and headaches.  Psychiatric/Behavioral: Negative for agitation and behavioral problems.  All other systems reviewed and are negative.      Objective:   Physical Exam  Constitutional: She is oriented to person, place, and time. She  appears well-developed and well-nourished.  HENT:  Head: Normocephalic.  Right Ear: External ear normal.  Left Ear: External ear normal.  Eyes: Pupils are equal, round, and reactive to light.  Neck: Normal range of motion. No thyromegaly present.  Cardiovascular: Normal rate, regular rhythm, normal heart sounds and intact distal pulses.   No murmur heard. Pulmonary/Chest: Effort normal and breath sounds normal. No respiratory distress. She has no wheezes.  Abdominal: Soft. Bowel sounds are normal. She exhibits no distension and no mass. There is no tenderness.  Genitourinary:  Genitourinary Comments: Pelvic exam within normal limits vaginal exam normal rectal exam normal status post hysterectomy  Musculoskeletal: Normal range of motion. She exhibits no edema or tenderness.  Lymphadenopathy:    She has no cervical adenopathy.  Neurological: She is alert and oriented to person, place, and time. She exhibits normal muscle tone.  Skin: Skin is warm and dry.  Psychiatric: She has a normal mood and affect. Her behavior is normal.          Assessment & Plan:  Impression 1 well adult exam diet exercise discussed screening blood work discussed #2 hypothyroidism TSH borderline low to tight will back off on thyroid dosage in discussed #3 elevated serum testosterone. Patient is going to a well-being/libido clinic in [place] and having pellets inserted into her. She claims she does not know what is in them. It looks like testosterone supplementation. Plan mammogram. Chest thyroid. Diet exercise discussed. Flu shot. Patient encouraged to get back with her "libido" specialist and find out exactly what  medication is administered to her

## 2016-04-22 ENCOUNTER — Encounter: Payer: Self-pay | Admitting: Family Medicine

## 2016-04-23 ENCOUNTER — Encounter: Payer: Self-pay | Admitting: Nurse Practitioner

## 2016-04-24 ENCOUNTER — Ambulatory Visit (INDEPENDENT_AMBULATORY_CARE_PROVIDER_SITE_OTHER): Payer: BLUE CROSS/BLUE SHIELD | Admitting: Family Medicine

## 2016-04-24 ENCOUNTER — Encounter: Payer: Self-pay | Admitting: Family Medicine

## 2016-04-24 VITALS — BP 128/84 | Temp 98.3°F | Ht 63.0 in | Wt 177.4 lb

## 2016-04-24 DIAGNOSIS — M940 Chondrocostal junction syndrome [Tietze]: Secondary | ICD-10-CM

## 2016-04-24 MED ORDER — ETODOLAC 400 MG PO TABS: 400.0000 mg | ORAL_TABLET | Freq: Two times a day (BID) | ORAL | 0 refills | Status: DC

## 2016-04-24 NOTE — Progress Notes (Signed)
   Subjective:    Patient ID: Latasha Lindsey, female    DOB: 06-10-1956, 60 y.o.   MRN: [id number]  HPI Patient in today for left breast pain. Onset for Friday. Has tried Aleve. Two aleave   Patient notes chest pain left anterior chest. Sharp in nature. Worse with a deep breath or movement. Worse when laughing. Recalls no injury or overuse.  Patient's worried because it seems to be in the same area that "you linger at" while doing the breast exam week before. I advised patient that I did not intentionally linger in this region and I in no way felt she had any kind of breast mass or anything akin to that. Patient due to have mammogram next week  Review of Systems No headache, no major weight loss or weight gain, no chest pain no back pain abdominal pain no change in bowel habits complete ROS otherwise negative     Objective:   Physical Exam Alert vitals stable, NAD. Blood pressure good on repeat. HEENT normal. Lungs clear. Heart regular rate and rhythm. With chaperone left breast was lifted the chest wall beneath was distinctly tender along the ribs.       Assessment & Plan:  Impression costochondritis. Highly doubt breast involvement also highly doubt intrathoracic involvement rationale discussed plan trial of Lodine twice a day with food symptomatic care discussed expect gradual resolution often this occurs without known history of trauma or overuse

## 2016-04-30 ENCOUNTER — Ambulatory Visit (HOSPITAL_COMMUNITY)
Admission: RE | Admit: 2016-04-30 | Discharge: 2016-04-30 | Disposition: A | Discharge status: Home / Self Care | Payer: BLUE CROSS/BLUE SHIELD | Source: Ambulatory Visit | Attending: Family Medicine | Admitting: Family Medicine

## 2016-04-30 DIAGNOSIS — Z1231 Encounter for screening mammogram for malignant neoplasm of breast: Secondary | ICD-10-CM | POA: Insufficient documentation

## 2016-05-01 ENCOUNTER — Telehealth: Payer: Self-pay | Admitting: Family Medicine

## 2016-05-01 NOTE — Telephone Encounter (Signed)
Pt dropped off some literature for the Dr that he was expecting. In folder in dr office.

## 2016-08-15 ENCOUNTER — Telehealth: Payer: Self-pay | Admitting: Family Medicine

## 2016-08-15 DIAGNOSIS — R42 Dizziness and giddiness: Secondary | ICD-10-CM

## 2016-08-15 NOTE — Telephone Encounter (Signed)
Sometimes carolyn uses pred for this, pred 10 four daily forthree days, three daily for three days, two daily for two days, ent ref dr Melene Plan

## 2016-08-15 NOTE — Telephone Encounter (Signed)
Spoke with patient she states that current flare of vertigo started 3 days ago.  She has been taking Flonase and started taking Sudafed today.  Also is taking ginger root supplement for nausea caused by the dizziness.   When seen in Aug for Vertigo steroids refused.  She is willing to try this now.  Please advise.

## 2016-08-15 NOTE — Telephone Encounter (Signed)
Pt is requesting a referral to the ent for her ongoing vertigo. Pt was seen by Hoyle Sauer for it recently and Hoyle Sauer suggested seeing the ent.

## 2016-08-15 NOTE — Telephone Encounter (Signed)
Called and left message for patient to return call.  

## 2016-08-16 NOTE — Telephone Encounter (Signed)
Patient declined prednisone. Referral to ENT in the system.

## 2017-03-04 ENCOUNTER — Encounter: Payer: Self-pay | Admitting: Nurse Practitioner

## 2017-03-04 ENCOUNTER — Ambulatory Visit (INDEPENDENT_AMBULATORY_CARE_PROVIDER_SITE_OTHER): Payer: BLUE CROSS/BLUE SHIELD | Admitting: Nurse Practitioner

## 2017-03-04 VITALS — BP 136/88 | Ht 63.0 in | Wt 195.0 lb

## 2017-03-04 DIAGNOSIS — R7303 Prediabetes: Secondary | ICD-10-CM

## 2017-03-04 DIAGNOSIS — M7751 Other enthesopathy of right foot: Secondary | ICD-10-CM | POA: Diagnosis not present

## 2017-03-04 DIAGNOSIS — E039 Hypothyroidism, unspecified: Secondary | ICD-10-CM

## 2017-03-04 DIAGNOSIS — E785 Hyperlipidemia, unspecified: Secondary | ICD-10-CM

## 2017-03-04 DIAGNOSIS — Z79899 Other long term (current) drug therapy: Secondary | ICD-10-CM | POA: Diagnosis not present

## 2017-03-04 NOTE — Progress Notes (Signed)
Subjective:  Presents for c/o a "knot" on the back of the right heel for over a year. Worse with certain shoes. Has tried to wear good quality shoes but still painful with no improvement. Has been getting testosterone "seeds" from a natural therapy office. Considering natural estrogen therapy from sweet potatoes (yams). Has had a hysterectomy; still has ovaries. Her foot problem has interfered with her walking program causing weight gain a few months ago.   Objective:   BP 136/88   Ht 5\' 3"  (1.6 m)   Wt 195 lb 0.4 oz (88.5 kg)   BMI 34.55 kg/m  NAD. Alert, oriented. Lungs clear. Heart RRR. Thyroid no tenderness; no mass or goiter. Large hard mass noted on the back of the right heel.   Assessment:   Problem List Items Addressed This Visit      Endocrine   Hypothyroidism   Relevant Orders   TSH   VITAMIN D 25 Hydroxy (Vit-D Deficiency, Fractures)     Other   Hyperlipidemia   Relevant Orders   Lipid panel   VITAMIN D 25 Hydroxy (Vit-D Deficiency, Fractures)   Prediabetes   Relevant Orders   VITAMIN D 25 Hydroxy (Vit-D Deficiency, Fractures)   Hemoglobin A1c    Other Visit Diagnoses    Retrocalcaneal bursitis (back of heel), right    -  Primary   High risk medication use       Relevant Orders   Hepatic function panel   VITAMIN D 25 Hydroxy (Vit-D Deficiency, Fractures)   Basic metabolic panel       Plan:  Refer to podiatry. Recommend labs and PE this fall. Consider statin if no improvement in lipid profile.

## 2017-03-04 NOTE — Addendum Note (Signed)
Addended by: Nilda Simmer on: 03/04/2017 12:40 PM   Modules accepted: Level of Service

## 2017-03-11 ENCOUNTER — Encounter: Payer: Self-pay | Admitting: Nurse Practitioner

## 2017-03-11 ENCOUNTER — Encounter: Payer: Self-pay | Admitting: Family Medicine

## 2017-03-30 LAB — HEMOGLOBIN A1C
Est. average glucose Bld gHb Est-mCnc: 134 mg/dL
HEMOGLOBIN A1C: 6.3 % — AB (ref 4.8–5.6)

## 2017-03-30 LAB — VITAMIN D 25 HYDROXY (VIT D DEFICIENCY, FRACTURES): VIT D 25 HYDROXY: 29.9 ng/mL — AB (ref 30.0–100.0)

## 2017-03-30 LAB — BASIC METABOLIC PANEL
BUN/Creatinine Ratio: 17 (ref 9–23)
BUN: 17 mg/dL (ref 6–24)
CALCIUM: 9.3 mg/dL (ref 8.7–10.2)
CHLORIDE: 105 mmol/L (ref 96–106)
CO2: 25 mmol/L (ref 20–29)
CREATININE: 1.03 mg/dL — AB (ref 0.57–1.00)
GFR calc Af Amer: 69 mL/min/{1.73_m2} (ref >59)
GFR calc non Af Amer: 60 mL/min/{1.73_m2} (ref >59)
GLUCOSE: 132 mg/dL — AB (ref 65–99)
Potassium: 4.8 mmol/L (ref 3.5–5.2)
Sodium: 144 mmol/L (ref 134–144)

## 2017-03-30 LAB — LIPID PANEL
CHOL/HDL RATIO: 6.2 ratio — AB (ref 0.0–4.4)
Cholesterol, Total: 261 mg/dL — ABNORMAL HIGH (ref 100–199)
HDL: 42 mg/dL (ref >39)
LDL CALC: 180 mg/dL — AB (ref 0–99)
TRIGLYCERIDES: 193 mg/dL — AB (ref 0–149)
VLDL CHOLESTEROL CAL: 39 mg/dL (ref 5–40)

## 2017-03-30 LAB — TSH: TSH: 6.82 u[IU]/mL — AB (ref 0.450–4.500)

## 2017-03-30 LAB — HEPATIC FUNCTION PANEL
ALT: 27 IU/L (ref 0–32)
AST: 23 IU/L (ref 0–40)
Albumin: 4.4 g/dL (ref 3.5–5.5)
Alkaline Phosphatase: 57 IU/L (ref 39–117)
Bilirubin Total: 0.2 mg/dL (ref 0.0–1.2)
Bilirubin, Direct: 0.08 mg/dL (ref 0.00–0.40)
Total Protein: 6.6 g/dL (ref 6.0–8.5)

## 2017-04-22 ENCOUNTER — Encounter: Payer: Self-pay | Admitting: Nurse Practitioner

## 2017-04-22 ENCOUNTER — Ambulatory Visit (INDEPENDENT_AMBULATORY_CARE_PROVIDER_SITE_OTHER): Payer: BLUE CROSS/BLUE SHIELD | Admitting: Nurse Practitioner

## 2017-04-22 VITALS — BP 130/82 | Ht 63.0 in | Wt 202.0 lb

## 2017-04-22 DIAGNOSIS — Z79899 Other long term (current) drug therapy: Secondary | ICD-10-CM | POA: Diagnosis not present

## 2017-04-22 DIAGNOSIS — E039 Hypothyroidism, unspecified: Secondary | ICD-10-CM | POA: Diagnosis not present

## 2017-04-22 DIAGNOSIS — Z1231 Encounter for screening mammogram for malignant neoplasm of breast: Secondary | ICD-10-CM | POA: Diagnosis not present

## 2017-04-22 DIAGNOSIS — E785 Hyperlipidemia, unspecified: Secondary | ICD-10-CM | POA: Diagnosis not present

## 2017-04-22 DIAGNOSIS — K625 Hemorrhage of anus and rectum: Secondary | ICD-10-CM | POA: Diagnosis not present

## 2017-04-22 DIAGNOSIS — Z01419 Encounter for gynecological examination (general) (routine) without abnormal findings: Secondary | ICD-10-CM | POA: Diagnosis not present

## 2017-04-22 DIAGNOSIS — R5383 Other fatigue: Secondary | ICD-10-CM

## 2017-04-22 DIAGNOSIS — Z23 Encounter for immunization: Secondary | ICD-10-CM | POA: Diagnosis not present

## 2017-04-22 DIAGNOSIS — R7303 Prediabetes: Secondary | ICD-10-CM

## 2017-04-22 LAB — POCT HEMOGLOBIN: HEMOGLOBIN: 14.8 g/dL (ref 12.2–16.2)

## 2017-04-22 MED ORDER — ROSUVASTATIN CALCIUM 10 MG PO TABS: 10.0000 mg | ORAL_TABLET | Freq: Every day | ORAL | 2 refills | Status: DC

## 2017-04-22 MED ORDER — LEVOTHYROXINE SODIUM 75 MCG PO TABS: ORAL_TABLET | ORAL | 1 refills | Status: DC

## 2017-04-23 ENCOUNTER — Encounter: Payer: Self-pay | Admitting: Nurse Practitioner

## 2017-04-23 NOTE — Progress Notes (Signed)
Subjective:    Patient ID: Latasha Lindsey, female    DOB: Jun 21, 1956, 61 y.o.   MRN: [id number]  HPI  presents for her wellness exam. Has had a partial hysterectomy. No pelvic pain. Same sexual partner for the past year. Defers STD testing. Is due for an eye exam the last one was about 3-4 years ago. Regular dental care. No regular activity. Has quit smoking. [facility] her father had heart disease. Is due for her routine lab work.    Review of Systems  Constitutional: Positive for fatigue. Negative for activity change and appetite change.  HENT: Negative for dental problem, ear pain, sinus pressure and sore throat.   Respiratory: Negative for cough, chest tightness, shortness of breath and wheezing.   Cardiovascular: Negative for chest pain.  Gastrointestinal: Positive for blood in stool. Negative for abdominal distention, abdominal pain, constipation, diarrhea, nausea and vomiting.       Describes her bowel movements as soft, consistency of pudding. Has had 2 separate episodes of small amount of bright red blood with wiping after BM. Her last colonoscopy was about 8 years ago, will only 1 benign polyp was noted.  Genitourinary: Negative for difficulty urinating, dysuria, enuresis, frequency, genital sores, pelvic pain, urgency and vaginal discharge.   Depression screen PHQ 2/9 04/22/2017  Decreased Interest 0  Down, Depressed, Hopeless 0  PHQ - 2 Score 0       Objective:   Physical Exam  Constitutional: She is oriented to person, place, and time. She appears well-developed. No distress.  HENT:  Right Ear: External ear normal.  Left Ear: External ear normal.  Mouth/Throat: Oropharynx is clear and moist.  Neck: Normal range of motion. Neck supple. No tracheal deviation present. No thyromegaly present.  Cardiovascular: Normal rate, regular rhythm and normal heart sounds.  Exam reveals no gallop.   No murmur heard. Pulmonary/Chest: Effort normal and breath sounds normal.  Abdominal: Soft.  She exhibits no distension. There is no tenderness.  Genitourinary: Vagina normal. No vaginal discharge found.  Genitourinary Comments: External GU no rashes or lesions. Vagina tissue pink and moist, no discharge. Bimanual exam no tenderness or obvious masses, limited due to abdominal girth.  Musculoskeletal: She exhibits no edema.  Lymphadenopathy:    She has no cervical adenopathy.  Neurological: She is alert and oriented to person, place, and time.  Skin: Skin is warm and dry. No rash noted.  Psychiatric: She has a normal mood and affect. Her behavior is normal.  Vitals reviewed. Breast exam: No masses noted. Nipples normal limit in appearance. Axilla no adenopathy. Recent Results (from the past 2160 hour(s))  Lipid panel     Status: Abnormal   Collection Time: 03/29/17  8:22 AM  Result Value Ref Range   Cholesterol, Total 261 (H) 100 - 199 mg/dL   Triglycerides 193 (H) 0 - 149 mg/dL   HDL 42 >39 mg/dL   VLDL Cholesterol Cal 39 5 - 40 mg/dL   LDL Calculated 180 (H) 0 - 99 mg/dL   Chol/HDL Ratio 6.2 (H) 0.0 - 4.4 ratio    Comment:                                   T. Chol/HDL Ratio  Men  Women                               1/2 Avg.Risk  3.4    3.3                                   Avg.Risk  5.0    4.4                                2X Avg.Risk  9.6    7.1                                3X Avg.Risk 23.4   11.0   Hepatic function panel     Status: None   Collection Time: 03/29/17  8:22 AM  Result Value Ref Range   Total Protein 6.6 6.0 - 8.5 g/dL   Albumin 4.4 3.5 - 5.5 g/dL   Bilirubin Total 0.2 0.0 - 1.2 mg/dL   Bilirubin, Direct 0.08 0.00 - 0.40 mg/dL   Alkaline Phosphatase 57 39 - 117 IU/L   AST 23 0 - 40 IU/L   ALT 27 0 - 32 IU/L  TSH     Status: Abnormal   Collection Time: 03/29/17  8:22 AM  Result Value Ref Range   TSH 6.820 (H) 0.450 - 4.500 uIU/mL  VITAMIN D 25 Hydroxy (Vit-D Deficiency, Fractures)     Status: Abnormal    Collection Time: 03/29/17  8:22 AM  Result Value Ref Range   Vit D, 25-Hydroxy 29.9 (L) 30.0 - 100.0 ng/mL    Comment: Vitamin D deficiency has been defined by the [place] and an Endocrine Society practice guideline as a level of serum 25-OH vitamin D less than 20 ng/mL (1,2). The Endocrine Society went on to further define vitamin D insufficiency as a level between 21 and 29 ng/mL (2). 1. IOM (Institute of Medicine). 2010. Dietary reference    intakes for calcium and D. [place]: The    [employer]. 2. Holick MF, Binkley [place], Bischoff-Ferrari HA, et al.    Evaluation, treatment, and prevention of vitamin D    deficiency: an Endocrine Society clinical practice    guideline. JCEM. 2011 Jul; 96(7):1911-30.   Hemoglobin A1c     Status: Abnormal   Collection Time: 03/29/17  8:22 AM  Result Value Ref Range   Hgb A1c MFr Bld 6.3 (H) 4.8 - 5.6 %    Comment:          Prediabetes: 5.7 - 6.4          Diabetes: >6.4          Glycemic control for adults with diabetes: <7.0    Est. average glucose Bld gHb Est-mCnc 134 mg/dL  Basic metabolic panel     Status: Abnormal   Collection Time: 03/29/17  8:22 AM  Result Value Ref Range   Glucose 132 (H) 65 - 99 mg/dL   BUN 17 6 - 24 mg/dL   Creatinine, Ser 1.03 (H) 0.57 - 1.00 mg/dL   GFR calc non Af Amer 60 >59 mL/min/1.73   GFR calc Af Amer 69 >59 mL/min/1.73   BUN/Creatinine Ratio 17 9 - 23   Sodium 144 134 - 144  mmol/L   Potassium 4.8 3.5 - 5.2 mmol/L   Chloride 105 96 - 106 mmol/L   CO2 25 20 - 29 mmol/L   Calcium 9.3 8.7 - 10.2 mg/dL  POCT hemoglobin     Status: None   Collection Time: 04/22/17  4:21 PM  Result Value Ref Range   Hemoglobin 14.8 12.2 - 16.2 g/dL   Reviewed lab work with patient from 03/29/2017.      Assessment & Plan:   Problem List Items Addressed This Visit      Endocrine   Hypothyroidism   Relevant Medications   levothyroxine (SYNTHROID, LEVOTHROID) 75 MCG tablet   Other  Relevant Orders   TSH     Other   Hyperlipidemia   Relevant Medications   rosuvastatin (CRESTOR) 10 MG tablet   Other Relevant Orders   Lipid panel   Prediabetes    Other Visit Diagnoses    Well woman exam    -  Primary   Encounter for screening mammogram for breast cancer       Relevant Orders   MM DIGITAL SCREENING BILATERAL   Need for vaccination       Relevant Orders   Flu Vaccine QUAD 36+ mos IM (Completed)   High risk medication use       Relevant Orders   Hepatic function panel   Other fatigue       Relevant Orders   POCT hemoglobin (Completed)   Bright red rectal bleeding          Strongly recommend starting cholesterol medicine to reduce risk of stroke or heart attack. Meds ordered this encounter  Medications  . levothyroxine (SYNTHROID, LEVOTHROID) 75 MCG tablet    Sig: One po qd    Dispense:  90 tablet    Refill:  1    Order Specific Question:   Supervising Provider    Answer:   Mikey Kirschner [[id number]]  . rosuvastatin (CRESTOR) 10 MG tablet    Sig: Take 1 tablet (10 mg total) by mouth daily. For cholesterol    Dispense:  30 tablet    Refill:  2    Order Specific Question:   Supervising Provider    Answer:   Mikey Kirschner [[id number]]   Also increase levothyroxine to one tab daily. Lab work ordered for 8-10 weeks from today. Recommend patient consider getting shingrix vaccine. Flu vaccine today. Because of changes in her stool as well as rare blood noted with BM, recommend referral back to GI specialist for further evaluation. Discussed importance of regular activity healthy diet and weight loss. Return in about 3 months (around 07/22/2017) for recheck.

## 2017-04-29 ENCOUNTER — Other Ambulatory Visit: Payer: Self-pay | Admitting: Nurse Practitioner

## 2017-04-29 DIAGNOSIS — Z1231 Encounter for screening mammogram for malignant neoplasm of breast: Secondary | ICD-10-CM

## 2017-05-01 ENCOUNTER — Ambulatory Visit (HOSPITAL_COMMUNITY)
Admission: RE | Admit: 2017-05-01 | Discharge: 2017-05-01 | Disposition: A | Discharge status: Home / Self Care | Payer: BLUE CROSS/BLUE SHIELD | Source: Ambulatory Visit | Attending: Nurse Practitioner | Admitting: Nurse Practitioner

## 2017-05-01 DIAGNOSIS — Z1231 Encounter for screening mammogram for malignant neoplasm of breast: Secondary | ICD-10-CM | POA: Diagnosis present

## 2017-05-25 ENCOUNTER — Other Ambulatory Visit: Payer: Self-pay | Admitting: Family Medicine

## 2017-06-24 ENCOUNTER — Ambulatory Visit: Payer: BLUE CROSS/BLUE SHIELD | Admitting: Family Medicine

## 2017-06-24 ENCOUNTER — Encounter: Payer: Self-pay | Admitting: Family Medicine

## 2017-06-24 VITALS — BP 122/82 | Temp 98.1°F | Ht 63.0 in | Wt 207.2 lb

## 2017-06-24 DIAGNOSIS — H6501 Acute serous otitis media, right ear: Secondary | ICD-10-CM

## 2017-06-24 MED ORDER — CEFPROZIL 500 MG PO TABS: 500.0000 mg | ORAL_TABLET | Freq: Two times a day (BID) | ORAL | 0 refills | Status: DC

## 2017-06-24 MED ORDER — FLUCONAZOLE 150 MG PO TABS: ORAL_TABLET | ORAL | 0 refills | Status: DC

## 2017-06-24 NOTE — Progress Notes (Signed)
   Subjective:    Patient ID: Latasha Lindsey, female    DOB: 07-Nov-1955, 61 y.o.   MRN: [id number]   Otalgia    There is pain in the right ear. The current episode started yesterday. She has tried NSAIDs for the symptoms.   Pain deep in right ear   Started yest morn   Was up in the ountains   No cough no cong  Painful     Pain used ibu and it hel[ed sone    Review of Systems  HENT: Positive for ear pain.        Objective:   Physical Exam  80 [id number] alert alert active HEENT right TM retraction.  Pharynx normal.  Neck supple.  Lungs clear.  Heart regular rate and rhythm      Assessment & Plan:   impression right otitis media plan antibiotics prescribed symptom care discussed warning signs discussed also add fluconazole.

## 2017-06-24 NOTE — Progress Notes (Deleted)
Subjective:     Patient ID: Latasha Lindsey, female   DOB: 01-05-1956, 61 y.o.   MRN: [id number]  HPI   Review of Systems     Objective:   Physical Exam     Assessment:     ***    Plan:     ***

## 2017-07-20 LAB — LIPID PANEL
CHOLESTEROL TOTAL: 152 mg/dL (ref 100–199)
Chol/HDL Ratio: 3.6 ratio (ref 0.0–4.4)
HDL: 42 mg/dL (ref >39)
LDL Calculated: 88 mg/dL (ref 0–99)
TRIGLYCERIDES: 110 mg/dL (ref 0–149)
VLDL Cholesterol Cal: 22 mg/dL (ref 5–40)

## 2017-07-20 LAB — HEPATIC FUNCTION PANEL
ALT: 22 IU/L (ref 0–32)
AST: 18 IU/L (ref 0–40)
Albumin: 4.5 g/dL (ref 3.5–5.5)
Alkaline Phosphatase: 63 IU/L (ref 39–117)
BILIRUBIN TOTAL: 0.4 mg/dL (ref 0.0–1.2)
BILIRUBIN, DIRECT: 0.1 mg/dL (ref 0.00–0.40)
Total Protein: 6.7 g/dL (ref 6.0–8.5)

## 2017-07-20 LAB — TSH: TSH: 4.44 u[IU]/mL (ref 0.450–4.500)

## 2017-07-22 ENCOUNTER — Ambulatory Visit: Payer: BLUE CROSS/BLUE SHIELD | Admitting: Nurse Practitioner

## 2017-07-26 ENCOUNTER — Ambulatory Visit: Payer: BLUE CROSS/BLUE SHIELD | Admitting: Nurse Practitioner

## 2017-07-26 ENCOUNTER — Encounter: Payer: Self-pay | Admitting: Nurse Practitioner

## 2017-07-26 VITALS — BP 124/76 | Ht 63.0 in | Wt 205.0 lb

## 2017-07-26 DIAGNOSIS — E785 Hyperlipidemia, unspecified: Secondary | ICD-10-CM | POA: Diagnosis not present

## 2017-07-26 DIAGNOSIS — E039 Hypothyroidism, unspecified: Secondary | ICD-10-CM

## 2017-07-26 MED ORDER — PHENTERMINE HCL 37.5 MG PO TABS: 37.5000 mg | ORAL_TABLET | Freq: Every day | ORAL | 0 refills | Status: DC

## 2017-07-26 MED ORDER — ROSUVASTATIN CALCIUM 10 MG PO TABS: 10.0000 mg | ORAL_TABLET | Freq: Every day | ORAL | 1 refills | Status: DC

## 2017-07-26 MED ORDER — LEVOTHYROXINE SODIUM 75 MCG PO TABS: ORAL_TABLET | ORAL | 1 refills | Status: DC

## 2017-07-26 NOTE — Patient Instructions (Addendum)
Follow up one month after starting Phentermine; start with 1/2 tab each morning.  Results for orders placed or performed in visit on 04/22/17  Lipid panel  Result Value Ref Range   Cholesterol, Total 152 100 - 199 mg/dL   Triglycerides 110 0 - 149 mg/dL   HDL 42 >39 mg/dL   VLDL Cholesterol Cal 22 5 - 40 mg/dL   LDL Calculated 88 0 - 99 mg/dL   Chol/HDL Ratio 3.6 0.0 - 4.4 ratio  Hepatic function panel  Result Value Ref Range   Total Protein 6.7 6.0 - 8.5 g/dL   Albumin 4.5 3.5 - 5.5 g/dL   Bilirubin Total 0.4 0.0 - 1.2 mg/dL   Bilirubin, Direct 0.10 0.00 - 0.40 mg/dL   Alkaline Phosphatase 63 39 - 117 IU/L   AST 18 0 - 40 IU/L   ALT 22 0 - 32 IU/L  TSH  Result Value Ref Range   TSH 4.440 0.450 - 4.500 uIU/mL  POCT hemoglobin  Result Value Ref Range   Hemoglobin 14.8 12.2 - 16.2 g/dL

## 2017-07-27 ENCOUNTER — Encounter: Payer: Self-pay | Admitting: Nurse Practitioner

## 2017-07-27 NOTE — Progress Notes (Signed)
Subjective: Presents to review her most recent lab work.  Compliant with medications.  No chest pain/ischemic type pain or unusual shortness of breath.  Would like to try medication to help with weight loss.  Has a history of rare sporadic pounding heart rate but no history of cardiac problems.  Objective:   BP 124/76   Ht 5\' 3"  (1.6 m)   Wt 205 lb (93 kg)   BMI 36.31 kg/m  NAD.  Alert, oriented.  Lungs clear.  Heart regular rate and rhythm. Results for orders placed or performed in visit on 04/22/17  Lipid panel  Result Value Ref Range   Cholesterol, Total 152 100 - 199 mg/dL   Triglycerides 110 0 - 149 mg/dL   HDL 42 >39 mg/dL   VLDL Cholesterol Cal 22 5 - 40 mg/dL   LDL Calculated 88 0 - 99 mg/dL   Chol/HDL Ratio 3.6 0.0 - 4.4 ratio  Hepatic function panel  Result Value Ref Range   Total Protein 6.7 6.0 - 8.5 g/dL   Albumin 4.5 3.5 - 5.5 g/dL   Bilirubin Total 0.4 0.0 - 1.2 mg/dL   Bilirubin, Direct 0.10 0.00 - 0.40 mg/dL   Alkaline Phosphatase 63 39 - 117 IU/L   AST 18 0 - 40 IU/L   ALT 22 0 - 32 IU/L  TSH  Result Value Ref Range   TSH 4.440 0.450 - 4.500 uIU/mL  POCT hemoglobin  Result Value Ref Range   Hemoglobin 14.8 12.2 - 16.2 g/dL   Reviewed labs with patient.  Has only been back on her thyroid medicine for about a month.    Assessment:   Problem List Items Addressed This Visit      Endocrine   Hypothyroidism - Primary   Relevant Medications   levothyroxine (SYNTHROID, LEVOTHROID) 75 MCG tablet     Other   Hyperlipidemia   Relevant Medications   rosuvastatin (CRESTOR) 10 MG tablet    Other Visit Diagnoses    Morbid obesity ([place])       Relevant Medications   phentermine (ADIPEX-P) 37.5 MG tablet       Plan:   Meds ordered this encounter  Medications  . levothyroxine (SYNTHROID, LEVOTHROID) 75 MCG tablet    Sig: One po qd    Dispense:  90 tablet    Refill:  1    Order Specific Question:   Supervising Provider    Answer:   Mikey Kirschner  [[id number]]  . rosuvastatin (CRESTOR) 10 MG tablet    Sig: Take 1 tablet (10 mg total) by mouth daily. For cholesterol    Dispense:  90 tablet    Refill:  1    Order Specific Question:   Supervising Provider    Answer:   Mikey Kirschner [[id number]]  . phentermine (ADIPEX-P) 37.5 MG tablet    Sig: Take 1 tablet (37.5 mg total) by mouth daily before breakfast.    Dispense:  30 tablet    Refill:  0    Order Specific Question:   Supervising Provider    Answer:   Mikey Kirschner [[id number]]   Continue current meds as directed.  Recommend rechecking labs including TSH in about 6 months.  Discussed importance of compliance with medications.  Start phentermine as directed.  Reviewed potential adverse effects including palpitations.  Start with half tab daily.  DC med and contact office if any problems.  Encouraged regular activity and healthy diet.  Otherwise recheck 1 month after starting phentermine.

## 2017-08-14 NOTE — Patient Instructions (Signed)
Latasha Lindsey  08/14/2017     @PREFPERIOPPHARMACY @   Your procedure is scheduled on 08/21/2017.  Report to [facility] at 6:30 A.M.  Call this number if you have problems the morning of surgery:  [phone]   Remember:  Do not eat food or drink liquids after midnight.  Take these medicines the morning of surgery with A SIP OF WATER Synthroid    (STOP PHENTERMINE)   Do not wear jewelry, make-up or nail polish.  Do not wear lotions, powders, or perfumes, or deodorant.  Do not shave 48 hours prior to surgery.  Men may shave face and neck.  Do not bring valuables to the hospital.  [facility] is not responsible for any belongings or valuables.  Contacts, dentures or bridgework may not be worn into surgery.  Leave your suitcase in the car.  After surgery it may be brought to your room.  For patients admitted to the hospital, discharge time will be determined by your treatment team.  Patients discharged the day of surgery will not be allowed to drive home.    Please read over the following fact sheets that you were given. Surgical Site Infection Prevention and Anesthesia Post-op Instructions     PATIENT INSTRUCTIONS POST-ANESTHESIA  IMMEDIATELY FOLLOWING SURGERY:  Do not drive or operate machinery for the first twenty four hours after surgery.  Do not make any important decisions for twenty four hours after surgery or while taking narcotic pain medications or sedatives.  If you develop intractable nausea and vomiting or a severe headache please notify your doctor immediately.  FOLLOW-UP:  Please make an appointment with your surgeon as instructed. You do not need to follow up with anesthesia unless specifically instructed to do so.  WOUND CARE INSTRUCTIONS (if applicable):  Keep a dry clean dressing on the anesthesia/puncture wound site if there is drainage.  Once the wound has quit draining you may leave it open to air.  Generally you should leave the bandage intact for twenty  four hours unless there is drainage.  If the epidural site drains for more than 36-48 hours please call the anesthesia department.  QUESTIONS?:  Please feel free to call your physician or the hospital operator if you have any questions, and they will be happy to assist you.      Achilles Tendon Rupture Surgery The Achilles tendon is a rope-like cord of tissue that connects the lower leg muscles to the heel. Achilles tendon repair is a surgery to repair an Achilles tendon that has been torn (ruptured). During the surgery the torn ends of the tendon are reconnected. This procedure is typically done in an outpatient surgery center. It usually takes 30 to 60 minutes to complete. The surgery is usually successful, but the recovery period can be long. Tell a health care provider about:  Any allergies you have.  All medicines you are taking, including vitamins, herbs, eye drops, creams, and over-the-counter medicines.  Any problems you or family members have had with anesthetic medicines.  Any blood disorders you have.  Any surgeries you have had.  Any medical conditions you have, including any skin conditions or infections you develop before surgery.  Whether you are pregnant or may be pregnant. What are the risks? Generally, this is a safe procedure. However, problems may occur, including:  Infection.  Bleeding.  Allergic reaction to medicines or dyes.  Blood clots.  Delayed healing.  Scarring.  Damage to other structures or organs, including damage to the nerve,  causing numbness.  Re-rupture of the tendon (rare).  What happens before the procedure? Staying hydrated Follow instructions from your health care provider about hydration, which may include:  Up to 2 hours before the procedure - you may continue to drink clear liquids, such as water, clear fruit juice, black coffee, and plain tea.  Eating and drinking restrictions Follow instructions from your health care  provider about eating and drinking, which may include:  8 hours before the procedure - stop eating heavy meals or foods such as meat, fried foods, or fatty foods.  6 hours before the procedure - stop eating light meals or foods, such as toast or cereal.  6 hours before the procedure - stop drinking milk or drinks that contain milk.  2 hours before the procedure - stop drinking clear liquids.  Medicines  Ask your health care provider about: ? Changing or stopping your regular medicines. This is especially important if you are taking diabetes medicines or blood thinners. ? Taking medicines such as aspirin and ibuprofen. These medicines can thin your blood. Do not take these medicines before your procedure if your health care provider instructs you not to.  You may be given antibiotic medicine to help prevent infection. General instructions  Your health care provider will examine the area from your lower leg to your heel.  Your health care provider may order tests such as ultrasound or MRI, and may perform exams to check if: ? You can point your toes up and down. ? Your foot is in proper alignment.  Do not use any products that contain nicotine or tobacco, such as cigarettes and e-cigarettes. If you need help quitting, ask your health care provider.  Shower or bathe on either the night before the surgery or the morning of the surgery.  Plan to have someone take you home from the hospital or clinic.  Ask your health care provider how your surgical site will be marked or identified. What happens during the procedure?  To reduce your risk of infection: ? Your health care team will wash or sanitize their hands. ? Your skin will be washed with soap. ? Hair may be removed from the surgical area. ? A drape will be positioned around your lower leg.  You will be given one or more of the following: ? A medicine to help you relax (sedative). ? A medicine that is injected into an area of  your body to numb everything below the injection site (regional anesthetic). ? A medicine to make you fall asleep (general anesthetic).  The surgeon will make an incision on the back side of your lower leg.  The torn ends of your tendon will be stitched back together.  The incisions will be closed with stitches (sutures) or staples.  A bandage (dressing) will be applied over the incision. The procedure may vary among health care providers and hospitals. What happens after the procedure?  Your blood pressure, heart rate, breathing rate, and blood oxygen level will be monitored until the medicines you were given have worn off.  You will feel some pain when the numbing medicine wears off. Your health care provider will prescribe pain medicine for you to take at home.  Your leg may be put in a cast or splint.  You will be given instructions to keep your leg above the level of your heart to reduce swelling and pain.  You will not be allowed to put weight on your leg.  You will need to use  crutches or another type of walking aid to keep weight off your leg.  You may continue to receive antibiotic medicine. Summary  The Achilles tendon is a rope-like cord of tissue that connects the lower leg muscles to the heel.  Achilles tendon repair is a surgery to repair an Achilles tendon that has been ruptured.  Follow your health care provider's instructions before the procedure, including instructions on what to eat and drink and whether to stop taking your regular medicines.  After your procedure, you will need to use crutches or another type of walking aid to keep weight off your leg. This information is not intended to replace advice given to you by your health care provider. Make sure you discuss any questions you have with your health care provider. Document Released: 08/04/2013 Document Revised: 07/16/2016 Document Reviewed: 07/16/2016 Elsevier Interactive Patient Education  2017  [place] A heel spur is a bony growth that forms on the bottom of your heel bone (calcaneus). Heel spurs are common and do not always cause pain. However, heel spurs often cause inflammation in the strong band of tissue that runs underneath the bone of your foot (plantar fascia). When this happens, you may feel pain on the bottom of your foot, near your heel. What are the causes? The cause of heel spurs is not completely understood. They may be caused by pressure on the heel. Or, they may stem from the muscle attachments (tendons) near the spur pulling on the heel. What increases the risk? You may be at risk for a heel spur if you:  Are older than 40.  Are overweight.  Have wear and tear arthritis (osteoarthritis).  Have plantar fascia inflammation.  What are the signs or symptoms? Some people have heel spurs but no symptoms. If you do have symptoms, they may include:  Pain in the bottom of your heel.  Pain that is worse when you first get out of bed.  Pain that gets worse after walking or standing.  How is this diagnosed? Your health care provider may diagnose a heel spur based on your symptoms and a physical exam. You may also have an X-ray of your foot to check for a bony growth coming from the calcaneus. How is this treated? Treatment aims to relieve the pain from the heel spur. This may include:  Stretching exercises.  Losing weight.  Wearing specific shoes, inserts, or orthotics for comfort and support.  Wearing splints at night to properly position your feet.  Taking over-the-counter medicine to relieve pain.  Being treated with high-intensity sound waves to break up the heel spur (extracorporeal shock wave therapy).  Getting steroid injections in your heel to reduce swelling and ease pain.  Having surgery if your heel spur causes long-term (chronic) pain.  Follow these instructions at home:  Take medicines only as directed by your health care  provider.  Ask your health care provider if you should use ice or cold packs on the painful areas of your heel or foot.  Avoid activities that cause you pain until you recover or as directed by your health care provider.  Stretch before exercising or being physically active.  Wear supportive shoes that fit well as directed by your health care provider. You might need to buy new shoes. Wearing old shoes or shoes that do not fit correctly may not provide the support that you need.  Lose weight if your health care provider thinks you should. This can relieve pressure on your  foot that may be causing pain and discomfort. Contact a health care provider if:  Your pain continues or gets worse. This information is not intended to replace advice given to you by your health care provider. Make sure you discuss any questions you have with your health care provider. Document Released: 09/05/2005 Document Revised: 01/05/2016 Document Reviewed: 09/30/2013 Elsevier Interactive Patient Education  [employer].

## 2017-08-15 ENCOUNTER — Other Ambulatory Visit: Payer: Self-pay | Admitting: Podiatry

## 2017-08-19 ENCOUNTER — Encounter (HOSPITAL_COMMUNITY): Payer: Self-pay

## 2017-08-19 ENCOUNTER — Other Ambulatory Visit (HOSPITAL_COMMUNITY): Payer: Self-pay | Admitting: Podiatry

## 2017-08-19 ENCOUNTER — Encounter (HOSPITAL_COMMUNITY)
Admission: RE | Admit: 2017-08-19 | Discharge: 2017-08-19 | Disposition: A | Discharge status: Home / Self Care | Payer: BLUE CROSS/BLUE SHIELD | Source: Ambulatory Visit | Attending: Podiatry | Admitting: Podiatry

## 2017-08-19 ENCOUNTER — Other Ambulatory Visit: Payer: Self-pay

## 2017-08-19 ENCOUNTER — Ambulatory Visit (HOSPITAL_COMMUNITY)
Admission: RE | Admit: 2017-08-19 | Discharge: 2017-08-19 | Disposition: A | Discharge status: Home / Self Care | Payer: BLUE CROSS/BLUE SHIELD | Source: Ambulatory Visit | Attending: Podiatry | Admitting: Podiatry

## 2017-08-19 DIAGNOSIS — M79671 Pain in right foot: Secondary | ICD-10-CM | POA: Diagnosis not present

## 2017-08-19 DIAGNOSIS — M7751 Other enthesopathy of right foot: Secondary | ICD-10-CM | POA: Insufficient documentation

## 2017-08-19 DIAGNOSIS — M7661 Achilles tendinitis, right leg: Secondary | ICD-10-CM | POA: Diagnosis not present

## 2017-08-19 DIAGNOSIS — Z01812 Encounter for preprocedural laboratory examination: Secondary | ICD-10-CM | POA: Insufficient documentation

## 2017-08-19 DIAGNOSIS — Z01810 Encounter for preprocedural cardiovascular examination: Secondary | ICD-10-CM | POA: Diagnosis present

## 2017-08-19 DIAGNOSIS — Q899 Congenital malformation, unspecified: Secondary | ICD-10-CM

## 2017-08-19 HISTORY — DX: Cardiac arrhythmia, unspecified: I49.9

## 2017-08-19 HISTORY — DX: Hypothyroidism, unspecified: E03.9

## 2017-08-19 LAB — CBC WITH DIFFERENTIAL/PLATELET
Basophils Absolute: 0.1 10*3/uL (ref 0.0–0.1)
Basophils Relative: 1 %
EOS ABS: 0.2 10*3/uL (ref 0.0–0.7)
EOS PCT: 2 %
HCT: 47.6 % — ABNORMAL HIGH (ref 36.0–46.0)
Hemoglobin: 15.2 g/dL — ABNORMAL HIGH (ref 12.0–15.0)
LYMPHS ABS: 3.1 10*3/uL (ref 0.7–4.0)
Lymphocytes Relative: 32 %
MCH: 31.2 pg (ref 26.0–34.0)
MCHC: 31.9 g/dL (ref 30.0–36.0)
MCV: 97.7 fL (ref 78.0–100.0)
MONO ABS: 0.9 10*3/uL (ref 0.1–1.0)
MONOS PCT: 10 %
Neutro Abs: 5.2 10*3/uL (ref 1.7–7.7)
Neutrophils Relative %: 55 %
PLATELETS: 269 10*3/uL (ref 150–400)
RBC: 4.87 MIL/uL (ref 3.87–5.11)
RDW: 12.7 % (ref 11.5–15.5)
WBC: 9.5 10*3/uL (ref 4.0–10.5)

## 2017-08-19 LAB — BASIC METABOLIC PANEL
Anion gap: 10 (ref 5–15)
BUN: 19 mg/dL (ref 6–20)
CHLORIDE: 107 mmol/L (ref 101–111)
CO2: 28 mmol/L (ref 22–32)
CREATININE: 1.04 mg/dL — AB (ref 0.44–1.00)
Calcium: 9.6 mg/dL (ref 8.9–10.3)
GFR calc Af Amer: >60 mL/min (ref >60)
GFR, EST NON AFRICAN AMERICAN: 58 mL/min — AB (ref >60)
Glucose, Bld: 95 mg/dL (ref 65–99)
Potassium: 4.1 mmol/L (ref 3.5–5.1)
SODIUM: 145 mmol/L (ref 135–145)

## 2017-08-19 LAB — HEMOGLOBIN A1C
HEMOGLOBIN A1C: 6.2 % — AB (ref 4.8–5.6)
Mean Plasma Glucose: 131.24 mg/dL

## 2017-08-19 LAB — GLUCOSE, CAPILLARY: Glucose-Capillary: 87 mg/dL (ref 65–99)

## 2017-08-20 MED ORDER — CEFAZOLIN (ANCEF) 1 G IV SOLR: 2.0000 g | INTRAVENOUS | Status: DC

## 2017-08-20 NOTE — Pre-Procedure Instructions (Signed)
HGBA1C routed to PCP.

## 2017-08-21 ENCOUNTER — Ambulatory Visit (HOSPITAL_COMMUNITY): Payer: BLUE CROSS/BLUE SHIELD | Admitting: Anesthesiology

## 2017-08-21 ENCOUNTER — Ambulatory Visit (HOSPITAL_COMMUNITY)
Admission: RE | Admit: 2017-08-21 | Discharge: 2017-08-21 | Disposition: A | Discharge status: Home / Self Care | Payer: BLUE CROSS/BLUE SHIELD | Source: Ambulatory Visit | Attending: Podiatry | Admitting: Podiatry

## 2017-08-21 ENCOUNTER — Ambulatory Visit (HOSPITAL_COMMUNITY): Payer: BLUE CROSS/BLUE SHIELD

## 2017-08-21 ENCOUNTER — Encounter (HOSPITAL_COMMUNITY)
Admission: RE | Disposition: A | Discharge status: Home / Self Care | Payer: Self-pay | Source: Ambulatory Visit | Attending: Podiatry

## 2017-08-21 DIAGNOSIS — E039 Hypothyroidism, unspecified: Secondary | ICD-10-CM | POA: Diagnosis not present

## 2017-08-21 DIAGNOSIS — M65871 Other synovitis and tenosynovitis, right ankle and foot: Secondary | ICD-10-CM | POA: Insufficient documentation

## 2017-08-21 DIAGNOSIS — R7303 Prediabetes: Secondary | ICD-10-CM | POA: Diagnosis not present

## 2017-08-21 DIAGNOSIS — M7661 Achilles tendinitis, right leg: Secondary | ICD-10-CM | POA: Diagnosis present

## 2017-08-21 DIAGNOSIS — Z87891 Personal history of nicotine dependence: Secondary | ICD-10-CM | POA: Diagnosis not present

## 2017-08-21 DIAGNOSIS — M7731 Calcaneal spur, right foot: Secondary | ICD-10-CM | POA: Insufficient documentation

## 2017-08-21 DIAGNOSIS — Z9889 Other specified postprocedural states: Secondary | ICD-10-CM

## 2017-08-21 DIAGNOSIS — M7750 Other enthesopathy of unspecified foot: Secondary | ICD-10-CM

## 2017-08-21 HISTORY — PX: HEEL SPUR RESECTION: SHX6410

## 2017-08-21 HISTORY — PX: ACHILLES TENDON SURGERY: SHX542

## 2017-08-21 LAB — GLUCOSE, CAPILLARY
Glucose-Capillary: 152 mg/dL — ABNORMAL HIGH (ref 65–99)
Glucose-Capillary: 161 mg/dL — ABNORMAL HIGH (ref 65–99)

## 2017-08-21 SURGERY — EXCISION, BONE SPUR, CALCANEUS
Anesthesia: General | Site: Foot | Laterality: Right

## 2017-08-21 MED ORDER — ONDANSETRON HCL 4 MG/2ML IJ SOLN
4.0000 mg | Freq: Once | INTRAMUSCULAR | Status: AC
  Administered 2017-08-21: 4 mg via INTRAVENOUS

## 2017-08-21 MED ORDER — FENTANYL CITRATE (PF) 100 MCG/2ML IJ SOLN
INTRAMUSCULAR | Status: DC | PRN
  Administered 2017-08-21 (×4): 50 ug via INTRAVENOUS

## 2017-08-21 MED ORDER — MIDAZOLAM HCL 2 MG/2ML IJ SOLN
INTRAMUSCULAR | Status: AC
  Filled 2017-08-21: qty 2

## 2017-08-21 MED ORDER — PROPOFOL 10 MG/ML IV BOLUS
INTRAVENOUS | Status: DC | PRN
  Administered 2017-08-21: 140 mg via INTRAVENOUS

## 2017-08-21 MED ORDER — CHLORHEXIDINE GLUCONATE CLOTH 2 % EX PADS: 6.0000 | MEDICATED_PAD | Freq: Once | CUTANEOUS | Status: DC

## 2017-08-21 MED ORDER — DEXAMETHASONE SODIUM PHOSPHATE 4 MG/ML IJ SOLN
INTRAMUSCULAR | Status: AC
Start: 2017-08-21 — End: 2017-08-21
  Filled 2017-08-21: qty 1

## 2017-08-21 MED ORDER — ONDANSETRON HCL 4 MG/2ML IJ SOLN
INTRAMUSCULAR | Status: AC
  Filled 2017-08-21: qty 2

## 2017-08-21 MED ORDER — FENTANYL CITRATE (PF) 250 MCG/5ML IJ SOLN
INTRAMUSCULAR | Status: AC
  Filled 2017-08-21: qty 5

## 2017-08-21 MED ORDER — LIDOCAINE HCL (PF) 1 % IJ SOLN
INTRAMUSCULAR | Status: AC
  Filled 2017-08-21: qty 5

## 2017-08-21 MED ORDER — CEFAZOLIN SODIUM-DEXTROSE 2-4 GM/100ML-% IV SOLN: 2.0000 g | INTRAVENOUS | Status: DC

## 2017-08-21 MED ORDER — LACTATED RINGERS IV SOLN
INTRAVENOUS | Status: DC
  Administered 2017-08-21: 1000 mL via INTRAVENOUS

## 2017-08-21 MED ORDER — ROCURONIUM BROMIDE 50 MG/5ML IV SOLN
INTRAVENOUS | Status: AC
  Filled 2017-08-21: qty 1

## 2017-08-21 MED ORDER — CEFAZOLIN SODIUM-DEXTROSE 2-4 GM/100ML-% IV SOLN
2.0000 g | Freq: Once | INTRAVENOUS | Status: AC
  Administered 2017-08-21: 2 g via INTRAVENOUS
  Filled 2017-08-21: qty 100

## 2017-08-21 MED ORDER — SUGAMMADEX SODIUM 200 MG/2ML IV SOLN
INTRAVENOUS | Status: AC
  Filled 2017-08-21: qty 2

## 2017-08-21 MED ORDER — SEVOFLURANE IN SOLN
RESPIRATORY_TRACT | Status: AC
  Filled 2017-08-21: qty 250

## 2017-08-21 MED ORDER — LIDOCAINE HCL (PF) 1 % IJ SOLN
INTRAMUSCULAR | Status: AC
  Filled 2017-08-21: qty 30

## 2017-08-21 MED ORDER — LIDOCAINE HCL 1 % IJ SOLN
INTRAMUSCULAR | Status: DC | PRN
  Administered 2017-08-21: 20 mL via INTRAMUSCULAR

## 2017-08-21 MED ORDER — 0.9 % SODIUM CHLORIDE (POUR BTL) OPTIME
TOPICAL | Status: DC | PRN
  Administered 2017-08-21: 1000 mL

## 2017-08-21 MED ORDER — PROPOFOL 10 MG/ML IV BOLUS
INTRAVENOUS | Status: AC
  Filled 2017-08-21: qty 40

## 2017-08-21 MED ORDER — GLYCOPYRROLATE 0.2 MG/ML IJ SOLN
INTRAMUSCULAR | Status: AC
Start: 2017-08-21 — End: 2017-08-21
  Filled 2017-08-21: qty 1

## 2017-08-21 MED ORDER — MIDAZOLAM HCL 2 MG/2ML IJ SOLN
1.0000 mg | INTRAMUSCULAR | Status: AC
  Administered 2017-08-21: 2 mg via INTRAVENOUS

## 2017-08-21 MED ORDER — BUPIVACAINE HCL (PF) 0.5 % IJ SOLN
INTRAMUSCULAR | Status: AC
  Filled 2017-08-21: qty 30

## 2017-08-21 MED ORDER — DEXAMETHASONE SODIUM PHOSPHATE 4 MG/ML IJ SOLN
4.0000 mg | Freq: Once | INTRAMUSCULAR | Status: AC
  Administered 2017-08-21: 4 mg via INTRAVENOUS

## 2017-08-21 MED ORDER — SUGAMMADEX SODIUM 500 MG/5ML IV SOLN
INTRAVENOUS | Status: DC | PRN
  Administered 2017-08-21: 200 mg via INTRAVENOUS

## 2017-08-21 MED ORDER — GLYCOPYRROLATE 0.2 MG/ML IJ SOLN
0.2000 mg | Freq: Once | INTRAMUSCULAR | Status: AC
  Administered 2017-08-21: 0.2 mg via INTRAVENOUS

## 2017-08-21 MED ORDER — MIDAZOLAM HCL 5 MG/5ML IJ SOLN
INTRAMUSCULAR | Status: DC | PRN
  Administered 2017-08-21: 2 mg via INTRAVENOUS

## 2017-08-21 MED ORDER — ROCURONIUM BROMIDE 100 MG/10ML IV SOLN
INTRAVENOUS | Status: DC | PRN
  Administered 2017-08-21: 40 mg via INTRAVENOUS

## 2017-08-21 MED ORDER — FENTANYL CITRATE (PF) 100 MCG/2ML IJ SOLN: 25.0000 ug | INTRAMUSCULAR | Status: DC | PRN

## 2017-08-21 SURGICAL SUPPLY — 51 items
APL SKNCLS STERI-STRIP NONHPOA (GAUZE/BANDAGES/DRESSINGS) ×1
BAG HAMPER (MISCELLANEOUS) ×2 IMPLANT
BANDAGE ELASTIC 4 LF NS (GAUZE/BANDAGES/DRESSINGS) ×2 IMPLANT
BANDAGE ELASTIC 4 VELCRO NS (GAUZE/BANDAGES/DRESSINGS) ×2 IMPLANT
BANDAGE ELASTIC 6 LF NS (GAUZE/BANDAGES/DRESSINGS) ×2 IMPLANT
BANDAGE ESMARK 4X12 BL STRL LF (DISPOSABLE) ×1 IMPLANT
BENZOIN TINCTURE PRP APPL 2/3 (GAUZE/BANDAGES/DRESSINGS) ×2 IMPLANT
BLADE 15 SAFETY STRL DISP (BLADE) ×4 IMPLANT
BLADE AVERAGE 25X9 (BLADE) ×4 IMPLANT
BLADE OSC/SAG 18.5X9 THN (BLADE) ×2 IMPLANT
BNDG CMPR MED 5X4 ELC HKLP NS (GAUZE/BANDAGES/DRESSINGS) ×1
BNDG CMPR MED 5X6 ELC HKLP NS (GAUZE/BANDAGES/DRESSINGS) ×1
BNDG CONFORM 2 STRL LF (GAUZE/BANDAGES/DRESSINGS) ×2 IMPLANT
BNDG ESMARK 4X12 BLUE STRL LF (DISPOSABLE) ×2
BNDG GAUZE ELAST 4 BULKY (GAUZE/BANDAGES/DRESSINGS) ×2 IMPLANT
BOOT STEPPER DURA LG (SOFTGOODS) ×2 IMPLANT
CHLORAPREP W/TINT 26ML (MISCELLANEOUS) ×2 IMPLANT
CLOTH BEACON ORANGE TIMEOUT ST (SAFETY) ×2 IMPLANT
COVER LIGHT HANDLE STERIS (MISCELLANEOUS) ×4 IMPLANT
CUFF TOURNIQUET SINGLE 34IN LL (TOURNIQUET CUFF) ×2 IMPLANT
DECANTER SPIKE VIAL GLASS SM (MISCELLANEOUS) ×4 IMPLANT
DRAPE OEC MINIVIEW 54X84 (DRAPES) ×2 IMPLANT
DRSG ADAPTIC 3X8 NADH LF (GAUZE/BANDAGES/DRESSINGS) ×2 IMPLANT
ELECT REM PT RETURN 9FT ADLT (ELECTROSURGICAL) ×2
ELECTRODE REM PT RTRN 9FT ADLT (ELECTROSURGICAL) ×1 IMPLANT
GAUZE SPONGE 4X4 12PLY STRL (GAUZE/BANDAGES/DRESSINGS) ×2 IMPLANT
GLOVE BIO SURGEON STRL SZ7 (GLOVE) ×2 IMPLANT
GLOVE BIO SURGEON STRL SZ7.5 (GLOVE) ×2 IMPLANT
GLOVE BIOGEL PI IND STRL 7.0 (GLOVE) ×2 IMPLANT
GLOVE BIOGEL PI INDICATOR 7.0 (GLOVE) ×2
GOWN STRL REUS W/TWL LRG LVL3 (GOWN DISPOSABLE) ×6 IMPLANT
IMPL SYS BIOCOMP ACH SPEED (Anchor) ×1 IMPLANT
IMPLANT SYS BIOCOMP ACH SPEED (Anchor) ×2 IMPLANT
KIT ROOM TURNOVER APOR (KITS) ×2 IMPLANT
MANIFOLD NEPTUNE II (INSTRUMENTS) ×2 IMPLANT
NEEDLE HYPO 18GX1.5 BLUNT FILL (NEEDLE) ×2 IMPLANT
NS IRRIG 1000ML POUR BTL (IV SOLUTION) ×2 IMPLANT
PACK BASIC LIMB (CUSTOM PROCEDURE TRAY) ×2 IMPLANT
PAD ARMBOARD 7.5X6 YLW CONV (MISCELLANEOUS) ×2 IMPLANT
RASP SM TEAR CROSS CUT (RASP) ×2 IMPLANT
SET BASIN LINEN APH (SET/KITS/TRAYS/PACK) ×2 IMPLANT
SPLINT FIBERGLASS 4X30 (CAST SUPPLIES) ×2 IMPLANT
SPONGE LAP 18X18 X RAY DECT (DISPOSABLE) ×2 IMPLANT
STRIP CLOSURE SKIN 1/2X4 (GAUZE/BANDAGES/DRESSINGS) ×2 IMPLANT
SUT ETHIBOND 3 0 (SUTURE) IMPLANT
SUT ETHILON 4 0 PS 2 18 (SUTURE) ×2 IMPLANT
SUT VIC AB 2-0 CT2 27 (SUTURE) ×2 IMPLANT
SUT VIC AB 4-0 PS2 27 (SUTURE) ×2 IMPLANT
SUT VICRYL AB 3-0 FS1 BRD 27IN (SUTURE) ×2 IMPLANT
SYR CONTROL 10ML LL (SYRINGE) ×4 IMPLANT
TOWEL OR 17X26 4PK STRL BLUE (TOWEL DISPOSABLE) ×2 IMPLANT

## 2017-08-21 NOTE — Transfer of Care (Signed)
Immediate Anesthesia Transfer of Care Note  Patient: Latasha Lindsey  Procedure(s) Performed: HEEL SPUR RESECTION - HAGLUND'S (Right Foot) ACHILLES TENDON REPAIR (Right Foot)  Patient Location: PACU  Anesthesia Type:General  Level of Consciousness: drowsy  Airway & Oxygen Therapy: Patient Spontanous Breathing and Patient connected to face mask oxygen  Post-op Assessment: Report given to RN, Post -op Vital signs reviewed and stable and Patient moving all extremities  Post vital signs: Reviewed and stable  Last Vitals:  Vitals:   08/21/17 0720 08/21/17 0725  BP: 120/89 128/85  Resp: 12 11  Temp:    SpO2: 94% 98%    Last Pain: There were no vitals filed for this visit.    Patients Stated Pain Goal: 0 (21/97/58 [id number])  Complications: No apparent anesthesia complications

## 2017-08-21 NOTE — Anesthesia Procedure Notes (Signed)
Procedure Name: Intubation Date/Time: 08/21/2017 7:46 AM Performed by: Charmaine Downs, CRNA Pre-anesthesia Checklist: Patient identified, Patient being monitored, Emergency Drugs available and Suction available Patient Re-evaluated:Patient Re-evaluated prior to induction Oxygen Delivery Method: Circle System Utilized Preoxygenation: Pre-oxygenation with 100% oxygen Induction Type: IV induction Ventilation: Mask ventilation without difficulty Laryngoscope Size: Mac and 4 Grade View: Grade I Tube type: Oral Tube size: 7.0 mm Number of attempts: 1 Airway Equipment and Method: stylet Placement Confirmation: ETT inserted through vocal cords under direct vision,  positive ETCO2 and breath sounds checked- equal and bilateral Secured at: 22 cm Tube secured with: Tape Dental Injury: Teeth and Oropharynx as per pre-operative assessment

## 2017-08-21 NOTE — H&P (Signed)
.  HISTORY AND PHYSICAL INTERVAL NOTE:  08/21/2017  7:14 AM  Latasha Lindsey  has presented today for surgery, with the diagnosis of right achilles tendonitis, haglund's deformity,retrocalcaneal heel spur, right foot pain.  The various methods of treatment have been discussed with the patient.  No guarantees were given.  After consideration of risks, benefits and other options for treatment, the patient has consented to surgery.  I have reviewed the patients' chart and labs.    A history and physical examination was performed in my office.  The patient was reexamined.  There have been no changes to this history and physical examination.  Tyson Babinski, DPM

## 2017-08-21 NOTE — Brief Op Note (Signed)
08/21/2017  9:47 AM  PATIENT:  Latasha Lindsey  62 y.o. female  PRE-OPERATIVE DIAGNOSIS:  right achilles tendonitis, ,retrocalcaneal heel spur, right foot pain  POST-OPERATIVE DIAGNOSIS:  right achilles tendonitis, retrocalcaneal heel spur, right foot pain  PROCEDURE:  Resection of the posterior heel spur right foot.  Debridement of the degenerative achilles tendon and repair of the right achilles tendon.  Application of the posterior splint.   SURGEON:  Surgeon(s) and Role:    * Posey Pronto, [place], DPM - Primary   ASSISTANTS:   Caprice Beaver, DPM - Assisting  ANESTHESIA:   general  EBL:  5 mL   BLOOD ADMINISTERED:none  DRAINS: none   LOCAL MEDICATIONS USED:  MARCAINE   , LIDOCAINE  and Amount: 20 ml Post op block  SPECIMEN:  Excision  DISPOSITION OF SPECIMEN:  PATHOLOGY  COUNTS:  YES  TOURNIQUET:   Total Tourniquet Time Documented: Thigh (Right) - 81 minutes Total: Thigh (Right) - 81 minutes   DICTATION: .Viviann Spare Dictation  PLAN OF CARE: Discharge to home after PACU  PATIENT DISPOSITION:  PACU - hemodynamically stable.   Delay start of Pharmacological VTE agent (>24hrs) due to surgical blood loss or risk of bleeding: not applicable

## 2017-08-21 NOTE — Anesthesia Preprocedure Evaluation (Signed)
Anesthesia Evaluation  Patient identified by MRN, date of birth, ID band Patient awake    Reviewed: Allergy & Precautions, NPO status , Patient's Chart, lab work & pertinent test results  Airway Mallampati: I  TM Distance: >3 FB Neck ROM: Full    Dental  (+) Teeth Intact   Pulmonary former smoker,    breath sounds clear to auscultation       Cardiovascular + dysrhythmias (palpitaions)  Rhythm:Regular Rate:Normal     Neuro/Psych Hx vertigo     GI/Hepatic negative GI ROS, Neg liver ROS,   Endo/Other  diabetes (pre-DM), Type 2Hypothyroidism   Renal/GU negative Renal ROS     Musculoskeletal   Abdominal   Peds  Hematology negative hematology ROS (+)   Anesthesia Other Findings   Reproductive/Obstetrics                             Anesthesia Physical Anesthesia Plan  ASA: II  Anesthesia Plan: General   Post-op Pain Management:    Induction: Intravenous  PONV Risk Score and Plan:   Airway Management Planned: Oral ETT  Additional Equipment:   Intra-op Plan:   Post-operative Plan: Extubation in OR  Informed Consent: I have reviewed the patients History and Physical, chart, labs and discussed the procedure including the risks, benefits and alternatives for the proposed anesthesia with the patient or authorized representative who has indicated his/her understanding and acceptance.     Plan Discussed with:   Anesthesia Plan Comments: (Semi Prone position)        Anesthesia Quick Evaluation

## 2017-08-21 NOTE — Anesthesia Postprocedure Evaluation (Signed)
Anesthesia Post Note  Patient: Latasha Lindsey  Procedure(s) Performed: HEEL SPUR RESECTION - HAGLUND'S (Right Foot) ACHILLES TENDON REPAIR (Right Foot)  Patient location during evaluation: PACU Anesthesia Type: General Level of consciousness: awake and patient cooperative Pain management: pain level controlled Vital Signs Assessment: post-procedure vital signs reviewed and stable Respiratory status: spontaneous breathing, nonlabored ventilation and respiratory function stable Cardiovascular status: blood pressure returned to baseline Postop Assessment: no apparent nausea or vomiting Anesthetic complications: no     Last Vitals:  Vitals:   08/21/17 1030 08/21/17 1039  BP: 126/66   Pulse: 71 60  Resp: 10 (!) 9  Temp:    SpO2: 93% 100%    Last Pain:  Vitals:   08/21/17 1039  PainSc: Asleep                 Caren Garske J

## 2017-08-21 NOTE — Discharge Instructions (Signed)
These instructions will give you an idea of what to expect after surgery and how to manage issues that may arise before your first post op office visit. ° °Pain Management °Pain is best managed by “staying ahead” of it. If pain gets out of control, it is difficult to get it back under control. Local anesthesia that lasts 6-8 hours is used to numb the foot and decrease pain.  For the best pain control, take the pain medication every 4 hours for the first 2 days post op. On the third day pain medication can be taken as needed.  ° °Post Op Nausea °Nausea is common after surgery, so it is managed proactively.  °If prescribed, use the prescribed nausea medication regularly for the first 2 days post op. ° °Bandages °Do not worry if there is blood on the bandage. What looks like a lot of blood on the bandage is actually a small amount. Blood on the dressing spreads out as it is absorbed by the gauze, the same way a drop of water spreads out on a paper towel.  °If the bandages feel wet or dry, stiff and uncomfortable, call the office during office hours and we will schedule a time for you to have the bandage changed.  °Unless you are specifically told otherwise, we will do the first bandage change in the office.  °Keep your bandage dry. If the bandage becomes wet or soiled, notify the office and we will schedule a time to change the bandage. ° °Activity °It is best to spend most of the first 2 days after surgery lying down with the foot elevated above the level of your heart. °You may put weight on your heel while wearing the surgical shoe.   °You may only get up to go to the restroom. ° °Driving °Do not drive until you are able to respond in an emergency (i.e. slam on the brakes). This usually occurs after the bone has healed - 6 to 8 weeks. ° °Call the Office °If you have a fever over 101°F.  °If you have increasing pain after the initial post op pain has settled down.  °If you have increasing redness, swelling, or  drainage.  °If you have any questions or concerns.  ° ° °PATIENT INSTRUCTIONS °POST-ANESTHESIA ° °IMMEDIATELY FOLLOWING SURGERY:  Do not drive or operate machinery for the first twenty four hours after surgery.  Do not make any important decisions for twenty four hours after surgery or while taking narcotic pain medications or sedatives.  If you develop intractable nausea and vomiting or a severe headache please notify your doctor immediately. ° °FOLLOW-UP:  Please make an appointment with your surgeon as instructed. You do not need to follow up with anesthesia unless specifically instructed to do so. ° °WOUND CARE INSTRUCTIONS (if applicable):  Keep a dry clean dressing on the anesthesia/puncture wound site if there is drainage.  Once the wound has quit draining you may leave it open to air.  Generally you should leave the bandage intact for twenty four hours unless there is drainage.  If the epidural site drains for more than 36-48 hours please call the anesthesia department. ° °QUESTIONS?:  Please feel free to call your physician or the hospital operator if you have any questions, and they will be happy to assist you.    ° ° ° °

## 2017-08-21 NOTE — Op Note (Signed)
08/21/2017  9:47 AM  PATIENT:  Latasha Lindsey  62 y.o. female  PRE-OPERATIVE DIAGNOSIS:  right achilles tendonitis, ,retrocalcaneal heel spur, right foot pain  POST-OPERATIVE DIAGNOSIS:  right achilles tendonitis, retrocalcaneal heel spur, right foot pain  PROCEDURE:  Resection of the posterior heel spur right foot.  Debridement of the degenerative achilles tendon and repair of the right achilles tendon.  Application of the posterior splint.   SURGEON:  Surgeon(s) and Role:    * Posey Pronto, Laporte, DPM - Primary   ASSISTANTS:   Caprice Beaver, DPM - Assisting  ANESTHESIA:   general  EBL:  5 mL   BLOOD ADMINISTERED:none  DRAINS: none   LOCAL MEDICATIONS USED:  MARCAINE   , LIDOCAINE  and Amount: 20 ml Post op block  SPECIMEN:  Excision  DISPOSITION OF SPECIMEN:  PATHOLOGY  COUNTS:  YES  TOURNIQUET:   Total Tourniquet Time Documented: Thigh (Right) - 81 minutes Total: Thigh (Right) - 81 minutes   DICTATION: .Viviann Spare Dictation  PLAN OF CARE: Discharge to home after PACU  PATIENT DISPOSITION:  PACU - hemodynamically stable.   Delay start of Pharmacological VTE agent (>24hrs) due to surgical blood loss or risk of bleeding: not applicable  Patient was brought into the operating room. Following IV sedation and General anesthesia patient was laid prone on to the operating table. Care was made to pad all the bony prominence.  Thigh tourniquet was applied to the surgical extremity. The foot and ankle were the prepped, scrubbed and draped in aseptic manner. Using an esmarch band the tourniquet on the surgical site was inflatted at 349mmHG.   Attention was directed toward right posterior heel. There was prominent bump visualized with skin discoloration at the posterior superior aspect of the calcaneus. A midline incision approach was planned. A 7 cm midline incision to achilles tendon was made down to skin and subcutaneous tissue with full flap at the inferior aspect of  the incision. Care was made to preserve the paratenon. Next the paratenon was dissected off carefully from achilles tendon. There was fragments of bony exostosis felt with the tendon and most inferior part of the incision. The achilles tendon was now visualized and thickened at the most inferior part. Achilles tendon was split in midline to the incision in full thickness, from posterior to anterior.  All tendinopathic tissue was removed from the tendon and around the tendon.  Achilles tendon distally was now reflected medially and laterally and expose the whole calcaneal tuberosity with a prominent posterior spur. Care was maintained to save medial and lateral attachments to assist with the accurate restoration of the Achilles' length. At this time the Haglund's prominence and spur were resected using a microsagittal saw. Power rasp was used to rasp down any bony prominence. Fluoroscopy was used to see if any bony prominent left. At this time surgical site was flushed with normal saline.  At this bone was ready to insert the speedbride and reattach the achilles tendon.  Two (2) 4.75 mm BioComposite SwiveLock anchors were drilled about 1cm apart from each other using drill guide from the set. The 2 holes were about 1 cm proximal to the distal insertion of the Achilles tendon and central to each half of the tendon. Then 4.75 mm SwiveLock tap was used to prepare the holes for the 4.75 mm SwiveLock anchors. Inserted two (2) 4.75 mm BioComposite SwiveLock anchors loaded with FiberTape suture, 1 blue and 1 white/black, into the proximal holes. Anchors were noted to be flushed with  bone. The anchor driver was then removed and needle attached to the 72mm fiber tape were sutured through the achilles tendon on each side. At this time the distal holes were drilled with 3.5 mm drill in the same manner as the proximal holes. Holes were tapped using 4.75mm SwiveLock anchors. After cutting the swedged portion on each  proximal anchor, retrieved 1 FiberTape suture tail from each proximal anchor (1 blue and 1 white/black) and preloaded them through the distal SwiveLock anchor eyelet. Tension of the FiberTape suture was adjusted and inserted the 4.75 mm SwiveLock anchor into the prepared distal bone socket until the anchor body contacts bone. Anchors were flushed to the bone. The tails on the distal row were cut and anchor was flushed to the bone. Fluoroscopy was used to confirm the position of the anchors. Tendon was firmly attached and strength was tested on the table. Remaining achilles tendon was suture using 2-0 fiber tape from the speedbridge set. The paratenon was closed using 4-0 Vicryl in running fashion. Sub cuticular stiches were done to close the skin using 3-0 Vicryl. Skin was closed using 3-0 Nylon. 18cc of mixture of 1:1 1% plain lidocaine with 0.5%marcaine was injected into the surgical site. Dry sterile dressing was applied. Fibreglass posterior splint was applied to the surgical extremity in plantar flexion position. Tourniquet was deflated. Capillary refill was immediate to all the toes. Patient was extubated and transferred to PACU. Vitals are stable. Patient to be nonweightbearing for 3 weeks and using crutches on surgical extremity.

## 2017-08-22 ENCOUNTER — Encounter (HOSPITAL_COMMUNITY): Payer: Self-pay | Admitting: Podiatry

## 2017-08-22 NOTE — Addendum Note (Signed)
Addendum  created 08/22/17 1050 by Lerry Liner, MD   [place] filed

## 2017-11-25 ENCOUNTER — Other Ambulatory Visit: Payer: Self-pay | Admitting: Nurse Practitioner

## 2017-12-12 ENCOUNTER — Ambulatory Visit (INDEPENDENT_AMBULATORY_CARE_PROVIDER_SITE_OTHER): Payer: BLUE CROSS/BLUE SHIELD | Admitting: Otolaryngology

## 2017-12-12 DIAGNOSIS — H8112 Benign paroxysmal vertigo, left ear: Secondary | ICD-10-CM

## 2018-03-31 ENCOUNTER — Ambulatory Visit (INDEPENDENT_AMBULATORY_CARE_PROVIDER_SITE_OTHER): Payer: BLUE CROSS/BLUE SHIELD | Admitting: Otolaryngology

## 2018-03-31 DIAGNOSIS — H8112 Benign paroxysmal vertigo, left ear: Secondary | ICD-10-CM

## 2018-04-21 ENCOUNTER — Other Ambulatory Visit: Payer: Self-pay

## 2018-04-21 MED ORDER — ROSUVASTATIN CALCIUM 10 MG PO TABS: ORAL_TABLET | ORAL | 0 refills | Status: DC

## 2018-04-30 ENCOUNTER — Ambulatory Visit (INDEPENDENT_AMBULATORY_CARE_PROVIDER_SITE_OTHER): Payer: BLUE CROSS/BLUE SHIELD | Admitting: Family Medicine

## 2018-04-30 ENCOUNTER — Encounter: Payer: Self-pay | Admitting: Family Medicine

## 2018-04-30 VITALS — BP 140/84 | Ht 63.0 in | Wt 208.2 lb

## 2018-04-30 DIAGNOSIS — N941 Unspecified dyspareunia: Secondary | ICD-10-CM

## 2018-04-30 DIAGNOSIS — R1319 Other dysphagia: Secondary | ICD-10-CM

## 2018-04-30 DIAGNOSIS — R1310 Dysphagia, unspecified: Secondary | ICD-10-CM

## 2018-04-30 DIAGNOSIS — Z0000 Encounter for general adult medical examination without abnormal findings: Secondary | ICD-10-CM

## 2018-04-30 DIAGNOSIS — E039 Hypothyroidism, unspecified: Secondary | ICD-10-CM | POA: Diagnosis not present

## 2018-04-30 DIAGNOSIS — R7303 Prediabetes: Secondary | ICD-10-CM

## 2018-04-30 DIAGNOSIS — Z23 Encounter for immunization: Secondary | ICD-10-CM

## 2018-04-30 DIAGNOSIS — R5383 Other fatigue: Secondary | ICD-10-CM

## 2018-04-30 DIAGNOSIS — E785 Hyperlipidemia, unspecified: Secondary | ICD-10-CM | POA: Diagnosis not present

## 2018-04-30 DIAGNOSIS — Z79899 Other long term (current) drug therapy: Secondary | ICD-10-CM

## 2018-04-30 MED ORDER — LEVOTHYROXINE SODIUM 75 MCG PO TABS: ORAL_TABLET | ORAL | 1 refills | Status: DC

## 2018-04-30 MED ORDER — ROSUVASTATIN CALCIUM 10 MG PO TABS: ORAL_TABLET | ORAL | 1 refills | Status: DC

## 2018-04-30 MED ORDER — PANTOPRAZOLE SODIUM 40 MG PO TBEC: 40.0000 mg | DELAYED_RELEASE_TABLET | Freq: Every day | ORAL | 1 refills | Status: DC

## 2018-04-30 MED ORDER — ZOSTER VAC RECOMB ADJUVANTED 50 MCG/0.5ML IM SUSR: 0.5000 mL | Freq: Once | INTRAMUSCULAR | 1 refills | Status: AC

## 2018-04-30 NOTE — Progress Notes (Signed)
Subjective:    Patient ID: Latasha Lindsey, female    DOB: 11-26-1955, 62 y.o.   MRN: [id number]  HPI  The patient comes in today for a wellness visit.    A review of their health history was completed.  A review of medications was also completed.  Any needed refills; no  Eating habits: try to eat healthy but overeats, tries to drink water, diet tea  Falls/  MVA accidents in past few months: none  Regular exercise: no- not since foot surgery in January, reports problem finding sneakers and still having issues with spur on right foot  Specialist pt sees on regular basis: ENT for vertigo  Preventative health issues were discussed.   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                                   2-lower back all the time for a few years                                   3- discuss weight gain                                   4- foot seems to get stuck in throat and chest                                                sometimes when she swallows                                    5- patient reports no sex drive-info on ADDY                                    6- patient wants to sleep with in 30 minutes of                                              eating                                    7- fatigue  Difficulty swallowing, food gets stuck in esophagus, drinks water and it will go down, happens about once a month for the last 6-8 months.  Reports it is painful when it happens.  Denies ever having to vomit or spit food out. Denies spitting up or coughing up blood. Occasionally has heartburn 2x per month, takes tums which relieves.   Drinks alcohol: 1-2 beers a day or 1-10 glasses of wine per day.  Does not have a drink everyday.  Former smoker.  Sexually active - one partner, reports painful sex, vaginal dryness, no vaginal bleeding  Review of Systems  Constitutional: Positive  for fatigue. Negative for chills, fever and unexpected weight change.  HENT: Negative for congestion, ear pain, sinus pressure, sinus pain and sore throat.   Eyes: Negative for discharge and visual disturbance.  Respiratory: Negative for cough, shortness of breath and wheezing.   Cardiovascular: Negative for chest pain and leg swelling.  Gastrointestinal: Negative for abdominal pain, blood in stool, constipation, diarrhea, nausea and vomiting.       Heartburn and dysphagia per HPI  Genitourinary: Positive for dyspareunia. Negative for difficulty urinating and hematuria.  Skin: Negative for color change.  Neurological: Negative for dizziness, weakness, light-headedness and headaches.  Hematological: Negative for adenopathy.  All other systems reviewed and are negative.      Objective:   Physical Exam  Constitutional: She is oriented to person, place, and time. She appears well-developed and well-nourished. No distress.  HENT:  Head: Normocephalic and atraumatic.  Mouth/Throat: Oropharynx is clear and moist.  Eyes: Pupils are equal, round, and reactive to light. Conjunctivae and EOM are normal. Right eye exhibits no discharge. Left eye exhibits no discharge.  Neck: Neck supple. No thyromegaly present.  Cardiovascular: Normal rate, regular rhythm and normal heart sounds.  No murmur heard. Pulmonary/Chest: Effort normal and breath sounds normal. No respiratory distress. She has no wheezes.  Abdominal: Soft. Bowel sounds are normal. She exhibits no distension and no mass. There is no tenderness.  Genitourinary:  Genitourinary Comments: Mild vaginal atrophy noted on pelvic exam, no pain or tenderness during bimanual exam.  Musculoskeletal: She exhibits no edema or deformity.  Lymphadenopathy:    She has no cervical adenopathy.  Neurological: She is alert and oriented to person, place, and time. Coordination normal.  Skin: Skin is warm and dry.  Psychiatric: She has a normal mood and  affect. Her behavior is normal. Judgment and thought content normal.  Nursing note and vitals reviewed.      Assessment & Plan:  1. Well adult exam Adult wellness-complete.wellness physical was conducted today. Importance of diet and exercise were discussed in detail.  In addition to this a discussion regarding safety was also covered. We also reviewed over immunizations and gave recommendations regarding current immunization needed for age. -Flu shot given today -Shingrix rx given   In addition to this additional areas were also touched on including: Preventative health exams needed:  Colonoscopy due in April 2020 Mammogram due this year - pt will schedule her own  Discussed that her concerns today are important and many would require further evaluation than what can be done at this one appointment, pt verbalized understanding and agreeable to schedule a f/u appointment to address these further.    Her BP was elevated in the office today. Pt reports normally BP are good. Encouraged she check her BP at home and keep a log over the next couple weeks and bring that with her to her f/u appt. She is agreeable to this.   Patient was advised yearly wellness exam   2. Esophageal dysphagia  Given pt's symptoms concerned for solid food dysphagia.  Referral to GI placed. Will start on protonix as rx.   - Ambulatory referral to Gastroenterology  3. Hypothyroidism, unspecified type Refilled Synthroid.  TSH ordered.  We will follow-up based on results.   - TSH  4. Prediabetes Pt's last A1c 6.2. Would like to follow  this closely as it is getting closer to T2DM diagnosis. Encouraged healthy eating plan and exercise.  - Hemoglobin M5P - Basic metabolic panel  5. Hyperlipidemia, unspecified hyperlipidemia type Continue crestor at current dose, appears well controlled based on last lipid panel.  Will recheck lipid panel.  - Lipid panel -Hepatic function panel  6. Dyspareunia in  female Will further evaluate at next f/u visit. Encouraged adequate lubrication.   7. Fatigue, unspecified type Will further evaluate at next f/u visit, but will check lab work so results are available at next visit, TSH, CBC, Met 7 ordered.  - CBC with Differential/Platelet   She will f/u in 2 weeks to further discuss and evaluate her concerns today. Will also need f/u in 6 months for chronic disease management.  As attending physician to this patient visit, this patient was seen in conjunction with the nurse practitioner.  The history,physical and treatment plan was reviewed with the nurse practitioner and pertinent findings were verified with the patient.  Also the treatment plan was reviewed with the patient while they were present. [id number]

## 2018-04-30 NOTE — Patient Instructions (Signed)
Solid food dysphagia - we will send in a referral to GI and call you to schedule an appointment.

## 2018-05-01 ENCOUNTER — Encounter: Payer: Self-pay | Admitting: Family Medicine

## 2018-05-01 ENCOUNTER — Other Ambulatory Visit: Payer: Self-pay | Admitting: Family Medicine

## 2018-05-01 DIAGNOSIS — Z1231 Encounter for screening mammogram for malignant neoplasm of breast: Secondary | ICD-10-CM

## 2018-05-05 ENCOUNTER — Ambulatory Visit (HOSPITAL_COMMUNITY): Payer: BLUE CROSS/BLUE SHIELD

## 2018-05-07 LAB — BASIC METABOLIC PANEL
BUN/Creatinine Ratio: 20 (ref 9–23)
BUN: 17 mg/dL (ref 6–24)
CALCIUM: 9 mg/dL (ref 8.7–10.2)
CO2: 23 mmol/L (ref 20–29)
CREATININE: 0.83 mg/dL (ref 0.57–1.00)
Chloride: 104 mmol/L (ref 96–106)
GFR calc Af Amer: 89 mL/min/{1.73_m2} (ref >59)
GFR, EST NON AFRICAN AMERICAN: 77 mL/min/{1.73_m2} (ref >59)
Glucose: 161 mg/dL — ABNORMAL HIGH (ref 65–99)
Potassium: 4.6 mmol/L (ref 3.5–5.2)
Sodium: 143 mmol/L (ref 134–144)

## 2018-05-07 LAB — HEPATIC FUNCTION PANEL
ALBUMIN: 4.5 g/dL (ref 3.5–5.5)
ALK PHOS: 61 IU/L (ref 39–117)
ALT: 24 IU/L (ref 0–32)
AST: 17 IU/L (ref 0–40)
Bilirubin Total: 0.4 mg/dL (ref 0.0–1.2)
Bilirubin, Direct: 0.12 mg/dL (ref 0.00–0.40)
Total Protein: 6.5 g/dL (ref 6.0–8.5)

## 2018-05-07 LAB — CBC WITH DIFFERENTIAL/PLATELET
Basophils Absolute: 0.1 10*3/uL (ref 0.0–0.2)
Basos: 1 %
EOS (ABSOLUTE): 0.3 10*3/uL (ref 0.0–0.4)
EOS: 4 %
HEMATOCRIT: 43.8 % (ref 34.0–46.6)
HEMOGLOBIN: 14.4 g/dL (ref 11.1–15.9)
IMMATURE GRANULOCYTES: 0 %
Immature Grans (Abs): 0 10*3/uL (ref 0.0–0.1)
Lymphocytes Absolute: 1.9 10*3/uL (ref 0.7–3.1)
Lymphs: 30 %
MCH: 31.2 pg (ref 26.6–33.0)
MCHC: 32.9 g/dL (ref 31.5–35.7)
MCV: 95 fL (ref 79–97)
Monocytes Absolute: 0.6 10*3/uL (ref 0.1–0.9)
Monocytes: 10 %
NEUTROS PCT: 55 %
Neutrophils Absolute: 3.6 10*3/uL (ref 1.4–7.0)
Platelets: 233 10*3/uL (ref 150–450)
RBC: 4.62 x10E6/uL (ref 3.77–5.28)
RDW: 12 % — ABNORMAL LOW (ref 12.3–15.4)
WBC: 6.4 10*3/uL (ref 3.4–10.8)

## 2018-05-07 LAB — LIPID PANEL
CHOL/HDL RATIO: 5.1 ratio — AB (ref 0.0–4.4)
Cholesterol, Total: 180 mg/dL (ref 100–199)
HDL: 35 mg/dL — AB (ref >39)
LDL Calculated: 94 mg/dL (ref 0–99)
Triglycerides: 255 mg/dL — ABNORMAL HIGH (ref 0–149)
VLDL Cholesterol Cal: 51 mg/dL — ABNORMAL HIGH (ref 5–40)

## 2018-05-07 LAB — HEMOGLOBIN A1C
Est. average glucose Bld gHb Est-mCnc: 146 mg/dL
Hgb A1c MFr Bld: 6.7 % — ABNORMAL HIGH (ref 4.8–5.6)

## 2018-05-07 LAB — TSH: TSH: 3.63 u[IU]/mL (ref 0.450–4.500)

## 2018-05-08 ENCOUNTER — Ambulatory Visit (HOSPITAL_COMMUNITY)
Admission: RE | Admit: 2018-05-08 | Discharge: 2018-05-08 | Disposition: A | Discharge status: Home / Self Care | Payer: BLUE CROSS/BLUE SHIELD | Source: Ambulatory Visit | Attending: Family Medicine | Admitting: Family Medicine

## 2018-05-08 ENCOUNTER — Encounter (HOSPITAL_COMMUNITY): Payer: Self-pay

## 2018-05-08 DIAGNOSIS — Z1231 Encounter for screening mammogram for malignant neoplasm of breast: Secondary | ICD-10-CM | POA: Diagnosis present

## 2018-05-15 ENCOUNTER — Ambulatory Visit: Payer: BLUE CROSS/BLUE SHIELD | Admitting: Family Medicine

## 2018-05-16 ENCOUNTER — Encounter: Payer: Self-pay | Admitting: Family Medicine

## 2018-05-16 ENCOUNTER — Ambulatory Visit (INDEPENDENT_AMBULATORY_CARE_PROVIDER_SITE_OTHER): Payer: BLUE CROSS/BLUE SHIELD | Admitting: Family Medicine

## 2018-05-16 VITALS — BP 128/82 | Ht 63.0 in | Wt 208.2 lb

## 2018-05-16 DIAGNOSIS — E1140 Type 2 diabetes mellitus with diabetic neuropathy, unspecified: Secondary | ICD-10-CM

## 2018-05-16 MED ORDER — METFORMIN HCL 500 MG PO TABS: ORAL_TABLET | ORAL | 3 refills | Status: DC

## 2018-05-16 NOTE — Patient Instructions (Addendum)
Bring glucometer with you to your next visit please.  Diabetes Mellitus and Nutrition When you have diabetes (diabetes mellitus), it is very important to have healthy eating habits because your blood sugar (glucose) levels are greatly affected by what you eat and drink. Eating healthy foods in the appropriate amounts, at about the same times every day, can help you:  Control your blood glucose.  Lower your risk of heart disease.  Improve your blood pressure.  Reach or maintain a healthy weight.  Every person with diabetes is different, and each person has different needs for a meal plan. Your health care provider may recommend that you work with a diet and nutrition specialist (dietitian) to make a meal plan that is best for you. Your meal plan may vary depending on factors such as:  The calories you need.  The medicines you take.  Your weight.  Your blood glucose, blood pressure, and cholesterol levels.  Your activity level.  Other health conditions you have, such as heart or kidney disease.  How do carbohydrates affect me? Carbohydrates affect your blood glucose level more than any other type of food. Eating carbohydrates naturally increases the amount of glucose in your blood. Carbohydrate counting is a method for keeping track of how many carbohydrates you eat. Counting carbohydrates is important to keep your blood glucose at a healthy level, especially if you use insulin or take certain oral diabetes medicines. It is important to know how many carbohydrates you can safely have in each meal. This is different for every person. Your dietitian can help you calculate how many carbohydrates you should have at each meal and for snack. Foods that contain carbohydrates include:  Bread, cereal, rice, pasta, and crackers.  Potatoes and corn.  Peas, beans, and lentils.  Milk and yogurt.  Fruit and juice.  Desserts, such as cakes, cookies, ice cream, and candy.  How does alcohol  affect me? Alcohol can cause a sudden decrease in blood glucose (hypoglycemia), especially if you use insulin or take certain oral diabetes medicines. Hypoglycemia can be a life-threatening condition. Symptoms of hypoglycemia (sleepiness, dizziness, and confusion) are similar to symptoms of having too much alcohol. If your health care provider says that alcohol is safe for you, follow these guidelines:  Limit alcohol intake to no more than 1 drink per day for nonpregnant women and 2 drinks per day for men. One drink equals 12 oz of beer, 5 oz of wine, or 1 oz of hard liquor.  Do not drink on an empty stomach.  Keep yourself hydrated with water, diet soda, or unsweetened iced tea.  Keep in mind that regular soda, juice, and other mixers may contain a lot of sugar and must be counted as carbohydrates.  What are tips for following this plan? Reading food labels  Start by checking the serving size on the label. The amount of calories, carbohydrates, fats, and other nutrients listed on the label are based on one serving of the food. Many foods contain more than one serving per package.  Check the total grams (g) of carbohydrates in one serving. You can calculate the number of servings of carbohydrates in one serving by dividing the total carbohydrates by 15. For example, if a food has 30 g of total carbohydrates, it would be equal to 2 servings of carbohydrates.  Check the number of grams (g) of saturated and trans fats in one serving. Choose foods that have low or no amount of these fats.  Check the number  of milligrams (mg) of sodium in one serving. Most people should limit total sodium intake to less than 2,300 mg per day.  Always check the nutrition information of foods labeled as "low-fat" or "nonfat". These foods may be higher in added sugar or refined carbohydrates and should be avoided.  Talk to your dietitian to identify your daily goals for nutrients listed on the  label. Shopping  Avoid buying canned, premade, or processed foods. These foods tend to be high in fat, sodium, and added sugar.  Shop around the outside edge of the grocery store. This includes fresh fruits and vegetables, bulk grains, fresh meats, and fresh dairy. Cooking  Use low-heat cooking methods, such as baking, instead of high-heat cooking methods like deep frying.  Cook using healthy oils, such as olive, canola, or sunflower oil.  Avoid cooking with butter, cream, or high-fat meats. Meal planning  Eat meals and snacks regularly, preferably at the same times every day. Avoid going long periods of time without eating.  Eat foods high in fiber, such as fresh fruits, vegetables, beans, and whole grains. Talk to your dietitian about how many servings of carbohydrates you can eat at each meal.  Eat 4-6 ounces of lean protein each day, such as lean meat, chicken, fish, eggs, or tofu. 1 ounce is equal to 1 ounce of meat, chicken, or fish, 1 egg, or 1/4 cup of tofu.  Eat some foods each day that contain healthy fats, such as avocado, nuts, seeds, and fish. Lifestyle   Check your blood glucose regularly.  Exercise at least 30 minutes 5 or more days each week, or as told by your health care provider.  Take medicines as told by your health care provider.  Do not use any products that contain nicotine or tobacco, such as cigarettes and e-cigarettes. If you need help quitting, ask your health care provider.  Work with a Social worker or diabetes educator to identify strategies to manage stress and any emotional and social challenges. What are some questions to ask my health care provider?  Do I need to meet with a diabetes educator?  Do I need to meet with a dietitian?  What number can I call if I have questions?  When are the best times to check my blood glucose? Where to find more information:  American Diabetes Association: diabetes.org/food-and-fitness/food  Academy of  Nutrition and Dietetics: PokerClues.dk  [employer] of Diabetes and Digestive and Kidney Diseases (NIH): ContactWire.be Summary  A healthy meal plan will help you control your blood glucose and maintain a healthy lifestyle.  Working with a diet and nutrition specialist (dietitian) can help you make a meal plan that is best for you.  Keep in mind that carbohydrates and alcohol have immediate effects on your blood glucose levels. It is important to count carbohydrates and to use alcohol carefully. This information is not intended to replace advice given to you by your health care provider. Make sure you discuss any questions you have with your health care provider. Document Released: 04/26/2005 Document Revised: 09/03/2016 Document Reviewed: 09/03/2016 Elsevier Interactive Patient Education  [employer].

## 2018-05-16 NOTE — Progress Notes (Signed)
Subjective:    Patient ID: Latasha Lindsey, female    DOB: February 24, 1956, 62 y.o.   MRN: [id number]  Hyperlipidemia  This is a chronic problem. The current episode started more than 1 year ago. Pertinent negatives include no chest pain or shortness of breath. Treatments tried: crestor. There are no compliance problems.    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 Reports some pain after she's been standing on her feet all day. Denies injury.  -Lower back pain - bilateral - denies any injury has been ongoing for over a year, described as sore and achey, pain is worse when standing, sitting down relieves, denies sciatica symptoms, takes 600-800 mg ibuprofen tid about 3 days a week - ibuprofen relieves pain enough for her to function as she would like to.   Reports palpitations - once every 6 weeks or so, has been ongoing for decades, lasts maybe 30 seconds but feels light-headed when this occurs. Reports she has seen a cardiologist for this 20 years ago or more who told her to quit smoking. States seems to have lessened as she has gotten older   Review of Systems  Respiratory: Negative for shortness of breath.   Cardiovascular: Negative for chest pain.  Musculoskeletal: Positive for back pain.  Neurological: Positive for numbness.  Negative  for weakness.       Objective:   Physical Exam  Constitutional: She is oriented to person, place, and time. She appears well-developed and well-nourished. No distress.  HENT:  Head: Normocephalic and atraumatic.  Eyes: Right eye exhibits no discharge. Left eye exhibits no discharge.  Neck: Neck supple. No thyromegaly present.  Cardiovascular: Normal rate, regular rhythm, normal heart sounds and intact distal pulses.  No murmur heard. Pulmonary/Chest: Effort normal and breath sounds normal. No respiratory distress.  Lymphadenopathy:    She has no cervical adenopathy.  Neurological: She is alert and oriented to person, place, and time.  Diminished sensation, paresthesias to bilateral feet - worse distally towards toes  Skin: Skin is warm and dry.  Psychiatric: She has a normal mood and affect.  Nursing note and vitals reviewed.      Assessment & Plan:  1. Type 2 diabetes mellitus with diabetic neuropathy, without long-term current use of insulin ([place])  Lengthy discussion on new diagnosis of T2DM. Strongly encouraged referral to nutritionist for diabetes education.  Patient is not interested at this time.  She will attend the free 1 hour diabetes course that the hospital offers.  Prescription given for metformin educated on side effects and increasing dose slowly.  Prescription for glucometer given and educated to check blood sugars fasting several times a week and to bring the information in with her at her next visit.  Information given on diabetes diet.  Encouraged healthy eating and physical activity.  Encouraged yearly dilated eye exams.  Discussed need for regular follow-up visits regarding diabetes management.  Her neuropathy symptoms are likely related to her diabetes.  And will hopefully decrease her resolve completely once blood sugars are under better control.  2.  Back pain: Described as chronic in nature.  No symptoms of sciatica or red flags.  Physical therapy offered  and patient refused.  May continue ibuprofen as needed.  Encouraged regular physical activity and weight loss.  Patient with multiple concerns at this visit.  We will follow-up with her in 6 weeks for diabetes may be able to address more of these issues at that time.  Dr. Richardson Landry was consulted on this visit he examined the patient and agreed with this plan of care.  25 minutes was spent with the patient.  This statement verifies that 25 minutes was indeed spent with the patient.  More than 50% of this visit-total duration of the visit-was spent in counseling and coordination of care. The issues that the patient came in for today as reflected in the diagnosis (s) please refer to documentation for further details.

## 2018-05-22 ENCOUNTER — Ambulatory Visit (INDEPENDENT_AMBULATORY_CARE_PROVIDER_SITE_OTHER): Payer: BLUE CROSS/BLUE SHIELD | Admitting: Otolaryngology

## 2018-05-22 DIAGNOSIS — H8112 Benign paroxysmal vertigo, left ear: Secondary | ICD-10-CM

## 2018-05-22 DIAGNOSIS — R42 Dizziness and giddiness: Secondary | ICD-10-CM | POA: Diagnosis not present

## 2018-06-19 ENCOUNTER — Ambulatory Visit (INDEPENDENT_AMBULATORY_CARE_PROVIDER_SITE_OTHER): Payer: BLUE CROSS/BLUE SHIELD | Admitting: Otolaryngology

## 2018-06-19 DIAGNOSIS — R42 Dizziness and giddiness: Secondary | ICD-10-CM

## 2018-06-19 DIAGNOSIS — H903 Sensorineural hearing loss, bilateral: Secondary | ICD-10-CM | POA: Diagnosis not present

## 2018-06-27 ENCOUNTER — Ambulatory Visit (INDEPENDENT_AMBULATORY_CARE_PROVIDER_SITE_OTHER): Payer: BLUE CROSS/BLUE SHIELD | Admitting: Family Medicine

## 2018-06-27 ENCOUNTER — Encounter: Payer: Self-pay | Admitting: Family Medicine

## 2018-06-27 VITALS — BP 130/86 | Ht 63.0 in | Wt 203.8 lb

## 2018-06-27 DIAGNOSIS — E039 Hypothyroidism, unspecified: Secondary | ICD-10-CM

## 2018-06-27 DIAGNOSIS — E1140 Type 2 diabetes mellitus with diabetic neuropathy, unspecified: Secondary | ICD-10-CM | POA: Diagnosis not present

## 2018-06-27 MED ORDER — ROSUVASTATIN CALCIUM 10 MG PO TABS: ORAL_TABLET | ORAL | 1 refills | Status: DC

## 2018-06-27 MED ORDER — PANTOPRAZOLE SODIUM 40 MG PO TBEC: 40.0000 mg | DELAYED_RELEASE_TABLET | Freq: Every day | ORAL | 1 refills | Status: DC

## 2018-06-27 MED ORDER — LEVOTHYROXINE SODIUM 75 MCG PO TABS: ORAL_TABLET | ORAL | 1 refills | Status: DC

## 2018-06-27 MED ORDER — METFORMIN HCL 500 MG PO TABS: ORAL_TABLET | ORAL | 3 refills | Status: DC

## 2018-06-27 NOTE — Progress Notes (Signed)
   Subjective:    Patient ID: Latasha Lindsey, female    DOB: [birth year], 62 y.o.   MRN: [id number]  Diabetes  She presents for her follow-up diabetic visit. She has type 2 diabetes mellitus. There are no hypoglycemic associated symptoms. There are no diabetic associated symptoms. There are no hypoglycemic complications. There are no diabetic complications.   Pt states the Metformin "cleans her out" about every 3 days and sometimes she has a tight cramp feeling in stomach. Pt states she has been checking sugar once in the morning.    Numbers geernally morning fasting are mostly between 125 and 140   Pt notes perhaps varying amnt of numbness in the feet    Three yrs ago for eye s cke   Walking and exrcising not doin much at this time   On d  Feet five six hrs per wk   Pt has not done as much lately   More conscious about foods    gi symtoms moderat e with the diabet   Sister nd mo now on metformin  And with diabete Review of Systems No headache, no major weight loss or weight gain, no chest pain no back pain abdominal pain no change in bowel habits complete ROS otherwise negative     Objective:   Physical Exam  Alert and oriented, vitals reviewed and stable, NAD ENT-TM's and ext canals WNL bilat via otoscopic exam Soft palate, tonsils and post pharynx WNL via oropharyngeal exam Neck-symmetric, no masses; thyroid nonpalpable and nontender Pulmonary-no tachypnea or accessory muscle use; Clear without wheezes via auscultation Card--no abnrml murmurs, rhythm reg and rate WNL Carotid pulses symmetric, without bruits       Assessment & Plan:  Impression type 2 diabetes.  Handling metformin reasonably well.  Some GI side effects.  Patient willing to tolerate.  Fasting sugars improving.  Exercising some.  Has adjusted diet.  No eye doctor for 3 years encouraged to go see one.  Exam today reveals ongoing sensory neuropathy.  Discussed at length.  Hopefully will improve with  ongoing improvement in her sugars.  Recheck in several months.  Concerns discussed  Greater than 50% of this 25 minute face to face visit was spent in counseling and discussion and coordination of care regarding the above diagnosis/diagnosies

## 2018-07-07 ENCOUNTER — Encounter: Payer: Self-pay | Admitting: Family Medicine

## 2018-08-21 ENCOUNTER — Other Ambulatory Visit: Payer: Self-pay

## 2018-08-21 MED ORDER — LEVOTHYROXINE SODIUM 75 MCG PO TABS: ORAL_TABLET | ORAL | 1 refills | Status: DC

## 2018-09-22 ENCOUNTER — Ambulatory Visit: Payer: PRIVATE HEALTH INSURANCE | Admitting: Family Medicine

## 2018-09-22 ENCOUNTER — Encounter: Payer: Self-pay | Admitting: Family Medicine

## 2018-09-22 VITALS — BP 122/80 | Ht 63.0 in | Wt 205.4 lb

## 2018-09-22 DIAGNOSIS — E119 Type 2 diabetes mellitus without complications: Secondary | ICD-10-CM

## 2018-09-22 DIAGNOSIS — R5383 Other fatigue: Secondary | ICD-10-CM

## 2018-09-22 DIAGNOSIS — E1140 Type 2 diabetes mellitus with diabetic neuropathy, unspecified: Secondary | ICD-10-CM | POA: Diagnosis not present

## 2018-09-22 DIAGNOSIS — R197 Diarrhea, unspecified: Secondary | ICD-10-CM

## 2018-09-22 DIAGNOSIS — E039 Hypothyroidism, unspecified: Secondary | ICD-10-CM | POA: Diagnosis not present

## 2018-09-22 DIAGNOSIS — E785 Hyperlipidemia, unspecified: Secondary | ICD-10-CM

## 2018-09-22 DIAGNOSIS — Z79899 Other long term (current) drug therapy: Secondary | ICD-10-CM

## 2018-09-22 LAB — POCT GLYCOSYLATED HEMOGLOBIN (HGB A1C): Hemoglobin A1C: 5.2 % (ref 4.0–5.6)

## 2018-09-22 MED ORDER — ROSUVASTATIN CALCIUM 10 MG PO TABS: ORAL_TABLET | ORAL | 1 refills | Status: DC

## 2018-09-22 MED ORDER — METFORMIN HCL 500 MG PO TABS: ORAL_TABLET | ORAL | 1 refills | Status: DC

## 2018-09-22 MED ORDER — LEVOTHYROXINE SODIUM 75 MCG PO TABS: ORAL_TABLET | ORAL | 1 refills | Status: DC

## 2018-09-22 MED ORDER — PANTOPRAZOLE SODIUM 40 MG PO TBEC: 40.0000 mg | DELAYED_RELEASE_TABLET | Freq: Every day | ORAL | 1 refills | Status: DC

## 2018-09-22 NOTE — Progress Notes (Signed)
   Subjective:    Patient ID: Latasha Lindsey, female    DOB: 1956/04/29, 63 y.o.   MRN: [id number]  Diabetes  She presents for her follow-up diabetic visit. She has type 2 diabetes mellitus. She is compliant with treatment all of the time. Home blood sugar record trend: highest is in the 130's. She sees a podiatrist (Dr. Caprice Beaver last January for surgery. has been released).Eye exam is not current.  having diarrhea a lot since increasing metformin to 2 bid.   Latasha Lindsey  Patient claims compliance with diabetes medication. No obvious side effects. Reports no substantial low sugar spells. Most numbers are generally in good range when checked fasting. Generally does not miss a dose of medication. Watching diabetic diet closely  Feet numbness and feet felling good these days    Stomach symtoms significant   Patient continues to take lipid medication regularly. No obvious side effects from it. Generally does not miss a dose. Prior blood work results are reviewed with patient. Patient continues to work on fat intake in diet   exericing more, walking  Mostly eating diet the right stuff  Eye doc viit due soon   Not always seelping the best tired at times          Work going ok, floors hurt pts feet   Review of Systems No headache, no major weight loss or weight gain, no chest pain no back pain abdominal pain no change in bowel habits complete ROS otherwise negative     Objective:   Physical Exam Alert and oriented, vitals reviewed and stable, NAD ENT-TM's and ext canals WNL bilat via otoscopic exam Soft palate, tonsils and post pharynx WNL via oropharyngeal exam Neck-symmetric, no masses; thyroid nonpalpable and nontender Pulmonary-no tachypnea or accessory muscle use; Clear without wheezes via auscultation Card--no abnrml murmurs, rhythm reg and rate WNL Carotid pulses symmetric, without bruits        Assessment & Plan:  Impression #1 diarrhea.  Onset since higher dose of  metformin.  Ongoing with minimal improvement.  Very aggravating at times.  Also associated with excessive gas.  Decr metforomin to one po bid  2.  Type 2 diabetes.  A1c type.  Regimen  Terms of symptomatology from medication.  Diet exercise discussed.  3.  Hyperlipidemia.  Current control uncertain.  Blood work recommended.  Medications refilled.  Diet discussed.  Follow-up in 6 months.  Further recommendations based on blood results

## 2018-09-22 NOTE — Addendum Note (Signed)
Addended by: Carmelina Noun on: 09/22/2018 04:11 PM   Modules accepted: Orders

## 2018-11-11 LAB — HEPATIC FUNCTION PANEL
ALT: 29 IU/L (ref 0–32)
AST: 23 IU/L (ref 0–40)
Albumin: 4.5 g/dL (ref 3.8–4.9)
Alkaline Phosphatase: 70 IU/L (ref 39–117)
Bilirubin Total: 0.3 mg/dL (ref 0.0–1.2)
Bilirubin, Direct: 0.1 mg/dL (ref 0.00–0.40)
Total Protein: 6.5 g/dL (ref 6.0–8.5)

## 2018-11-11 LAB — LIPID PANEL
CHOL/HDL RATIO: 4.2 ratio (ref 0.0–4.4)
CHOLESTEROL TOTAL: 173 mg/dL (ref 100–199)
HDL: 41 mg/dL (ref >39)
LDL CALC: 88 mg/dL (ref 0–99)
Triglycerides: 222 mg/dL — ABNORMAL HIGH (ref 0–149)
VLDL CHOLESTEROL CAL: 44 mg/dL — AB (ref 5–40)

## 2019-02-26 IMAGING — DX DG FOOT COMPLETE 3+V*R*
3 series · 3 of 3 positions shown · non-contrast
Comparison: None.

CLINICAL DATA: Preoperative examination for patient with Achilles
tendinopathy.

EXAM:
RIGHT FOOT COMPLETE - 3+ VIEW

[foot ap]
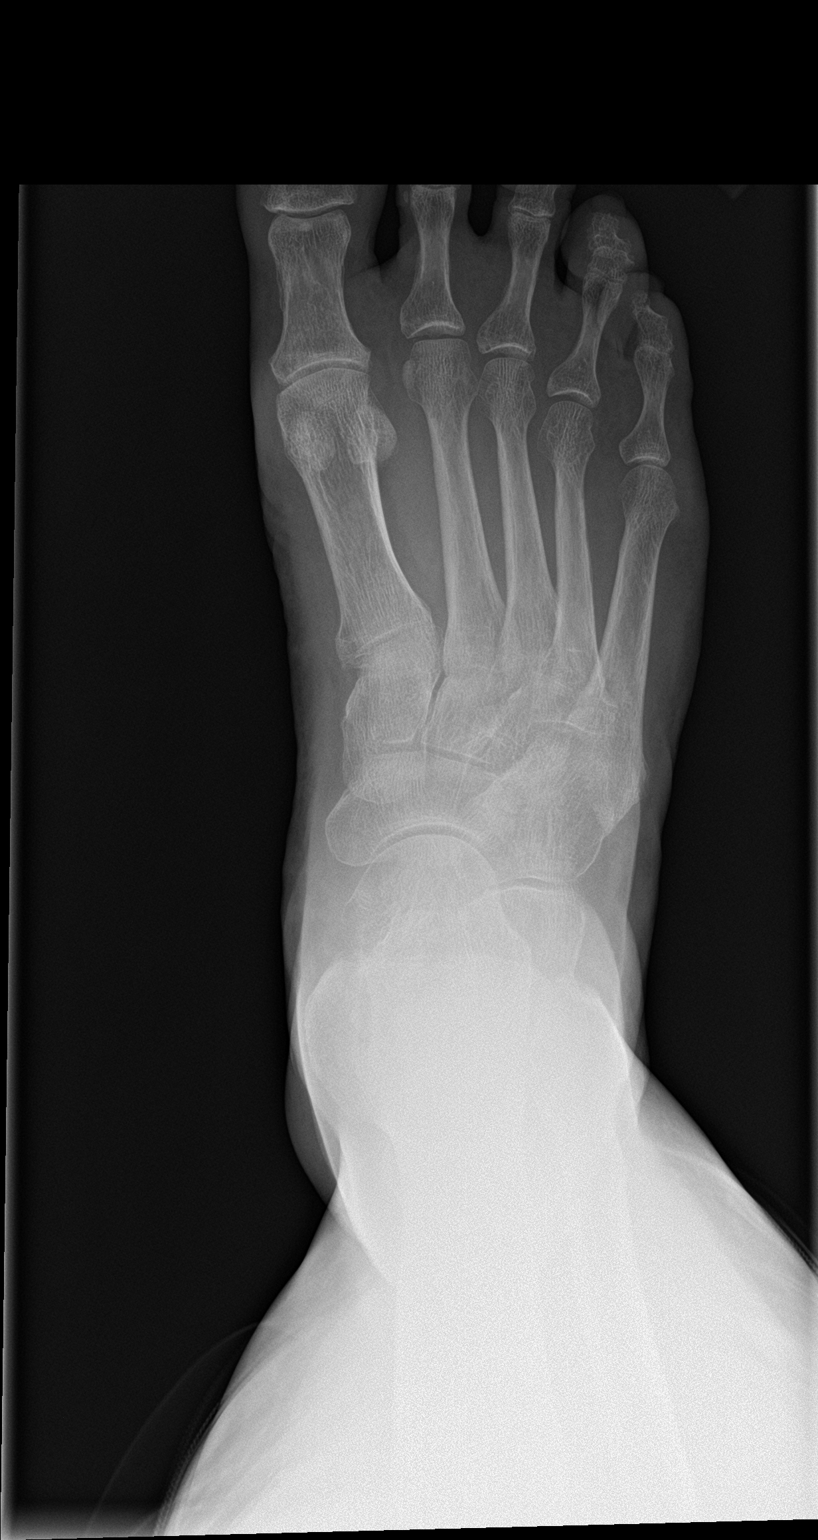

[foot obl]
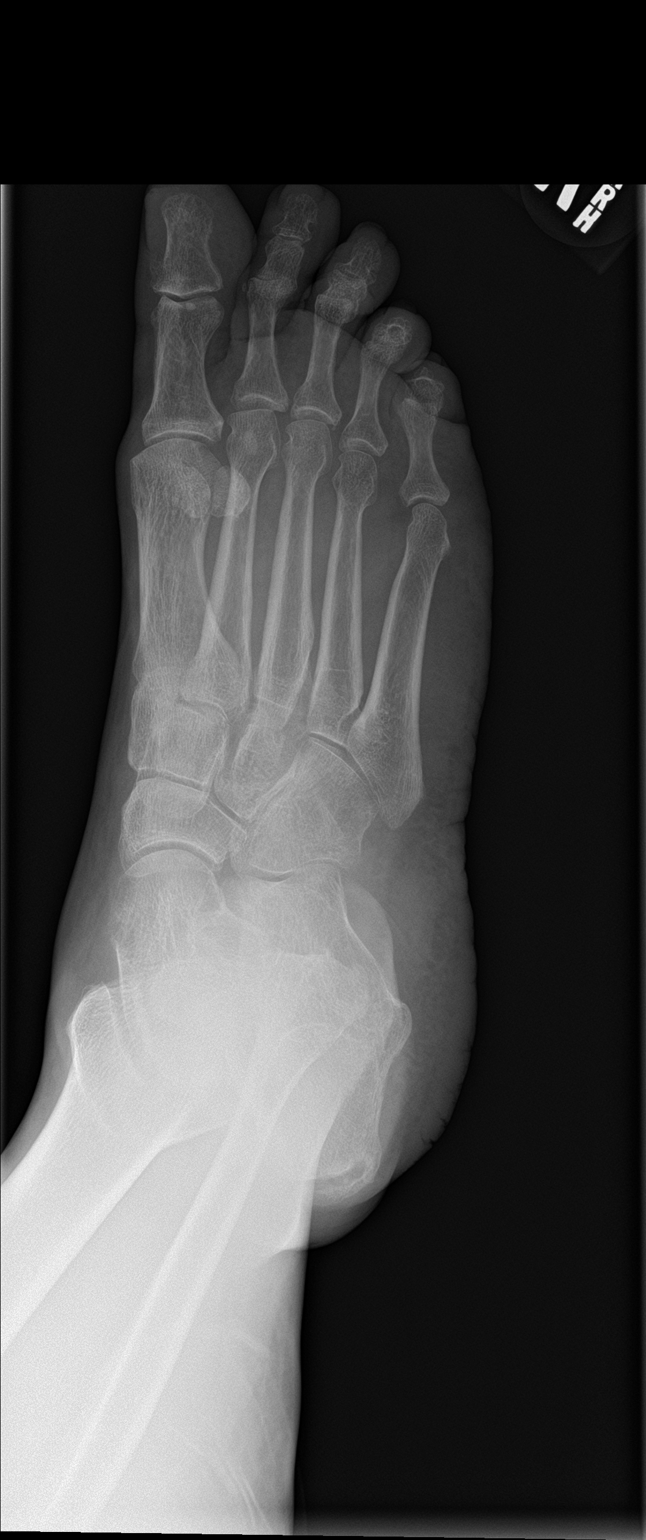

[foot lat]
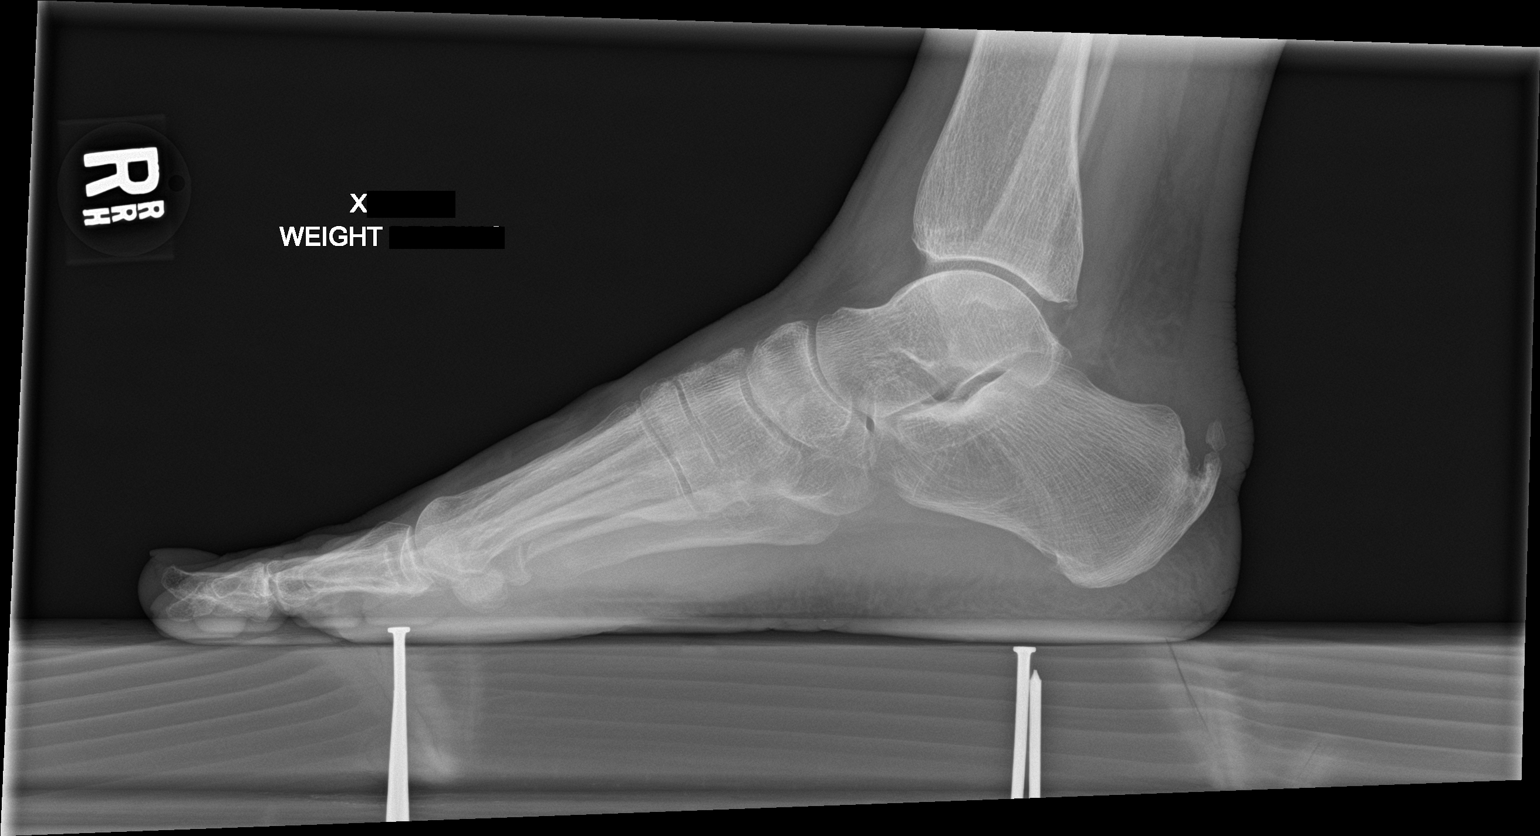

[3 of 3 positions shown; findings below may reference images not displayed]

FINDINGS: Prominent spur is seen at the Achilles tendon insertion on the
calcaneus. The distal Achilles is thickened. No acute bony or joint
abnormality is identified.
IMPRESSION: Findings consistent with Achilles tendinopathy. No acute
abnormality.

## 2019-02-27 ENCOUNTER — Telehealth: Payer: Self-pay | Admitting: Family Medicine

## 2019-02-27 DIAGNOSIS — Z79899 Other long term (current) drug therapy: Secondary | ICD-10-CM

## 2019-02-27 DIAGNOSIS — R5383 Other fatigue: Secondary | ICD-10-CM

## 2019-02-27 DIAGNOSIS — E785 Hyperlipidemia, unspecified: Secondary | ICD-10-CM

## 2019-02-27 DIAGNOSIS — E1140 Type 2 diabetes mellitus with diabetic neuropathy, unspecified: Secondary | ICD-10-CM

## 2019-02-27 NOTE — Telephone Encounter (Signed)
Please order CMP with GFR; Lipid; A1C and vitamin D. Thanks

## 2019-02-27 NOTE — Telephone Encounter (Signed)
Patientt is requesting labs for physical in September.

## 2019-02-27 NOTE — Telephone Encounter (Signed)
Blood work ordered in Epic. Left message to return call to notify patient. 

## 2019-03-02 NOTE — Telephone Encounter (Signed)
Pt returned call to office and verbalized understanding.

## 2019-03-23 ENCOUNTER — Ambulatory Visit: Payer: PRIVATE HEALTH INSURANCE | Admitting: Family Medicine

## 2019-04-14 LAB — COMPREHENSIVE METABOLIC PANEL
ALT: 36 IU/L — ABNORMAL HIGH (ref 0–32)
AST: 27 IU/L (ref 0–40)
Albumin/Globulin Ratio: 2.5 — ABNORMAL HIGH (ref 1.2–2.2)
Albumin: 4.7 g/dL (ref 3.8–4.9)
Alkaline Phosphatase: 61 IU/L (ref 39–117)
BUN/Creatinine Ratio: 17 (ref 12–28)
BUN: 19 mg/dL (ref 8–27)
Bilirubin Total: 0.3 mg/dL (ref 0.0–1.2)
CO2: 23 mmol/L (ref 20–29)
Calcium: 9.5 mg/dL (ref 8.7–10.3)
Chloride: 104 mmol/L (ref 96–106)
Creatinine, Ser: 1.12 mg/dL — ABNORMAL HIGH (ref 0.57–1.00)
GFR calc Af Amer: 62 mL/min/{1.73_m2} (ref >59)
GFR calc non Af Amer: 54 mL/min/{1.73_m2} — ABNORMAL LOW (ref >59)
Globulin, Total: 1.9 g/dL (ref 1.5–4.5)
Glucose: 142 mg/dL — ABNORMAL HIGH (ref 65–99)
Potassium: 4.7 mmol/L (ref 3.5–5.2)
Sodium: 142 mmol/L (ref 134–144)
Total Protein: 6.6 g/dL (ref 6.0–8.5)

## 2019-04-14 LAB — LIPID PANEL
Chol/HDL Ratio: 4.1 ratio (ref 0.0–4.4)
Cholesterol, Total: 157 mg/dL (ref 100–199)
HDL: 38 mg/dL — ABNORMAL LOW (ref >39)
LDL Chol Calc (NIH): 77 mg/dL (ref 0–99)
Triglycerides: 256 mg/dL — ABNORMAL HIGH (ref 0–149)
VLDL Cholesterol Cal: 42 mg/dL — ABNORMAL HIGH (ref 5–40)

## 2019-04-14 LAB — HEMOGLOBIN A1C
Est. average glucose Bld gHb Est-mCnc: 137 mg/dL
Hgb A1c MFr Bld: 6.4 % — ABNORMAL HIGH (ref 4.8–5.6)

## 2019-04-14 LAB — VITAMIN D 25 HYDROXY (VIT D DEFICIENCY, FRACTURES): Vit D, 25-Hydroxy: 24.6 ng/mL — ABNORMAL LOW (ref 30.0–100.0)

## 2019-04-24 ENCOUNTER — Other Ambulatory Visit: Payer: Self-pay | Admitting: *Deleted

## 2019-04-24 ENCOUNTER — Telehealth: Payer: Self-pay | Admitting: Family Medicine

## 2019-04-24 MED ORDER — METFORMIN HCL 500 MG PO TABS: ORAL_TABLET | ORAL | 0 refills | Status: DC

## 2019-04-24 NOTE — Telephone Encounter (Signed)
Patient has a physical coming up in a couple of weeks but will run out of metFORMIN (GLUCOPHAGE) 500 MG tablet before then.  [facility]

## 2019-04-24 NOTE — Telephone Encounter (Signed)
Pt.notified

## 2019-04-24 NOTE — Telephone Encounter (Signed)
Refill sent to pharm

## 2019-05-08 ENCOUNTER — Other Ambulatory Visit: Payer: Self-pay

## 2019-05-08 ENCOUNTER — Encounter: Payer: Self-pay | Admitting: Nurse Practitioner

## 2019-05-08 ENCOUNTER — Ambulatory Visit (INDEPENDENT_AMBULATORY_CARE_PROVIDER_SITE_OTHER): Payer: PRIVATE HEALTH INSURANCE | Admitting: Nurse Practitioner

## 2019-05-08 VITALS — BP 132/88 | Temp 95.9°F | Ht 63.5 in | Wt 209.6 lb

## 2019-05-08 DIAGNOSIS — Z23 Encounter for immunization: Secondary | ICD-10-CM | POA: Diagnosis not present

## 2019-05-08 DIAGNOSIS — Z1211 Encounter for screening for malignant neoplasm of colon: Secondary | ICD-10-CM | POA: Diagnosis not present

## 2019-05-08 DIAGNOSIS — Z1231 Encounter for screening mammogram for malignant neoplasm of breast: Secondary | ICD-10-CM

## 2019-05-08 DIAGNOSIS — Z0000 Encounter for general adult medical examination without abnormal findings: Secondary | ICD-10-CM | POA: Diagnosis not present

## 2019-05-08 DIAGNOSIS — E119 Type 2 diabetes mellitus without complications: Secondary | ICD-10-CM | POA: Diagnosis not present

## 2019-05-08 MED ORDER — LEVOTHYROXINE SODIUM 75 MCG PO TABS: ORAL_TABLET | ORAL | 1 refills | Status: DC

## 2019-05-08 MED ORDER — ROSUVASTATIN CALCIUM 10 MG PO TABS: ORAL_TABLET | ORAL | 1 refills | Status: DC

## 2019-05-08 MED ORDER — METFORMIN HCL 500 MG PO TABS: ORAL_TABLET | ORAL | 0 refills | Status: DC

## 2019-05-08 NOTE — Progress Notes (Signed)
Subjective:    Patient ID: Latasha Lindsey, female    DOB: Jul 26, 1956, 63 y.o.   MRN: [id number]  HPI The patient comes in today for a wellness visit. Pt is sleeping 4-5 hours per night. Reports unhealthy diet, eating out more, poor food choices. Rarely engaging in physical activity. Trying to start walking 30 minutes 3 times a week. Reports fasting blood sugars normally 140-150s. Takes Metformin as prescribed. Takes Crestor daily. Pt reports forgetting to take Omega-3 and Vitamin D. Pt has received both doses of shingrix vaccine. Would like flu vaccine. Has 1 sexual partner. Last eye exam 4-5 years ago, is working on scheduling one. Regular dental exams. No family or personal history of thyroid or endocrine cancer. No history of pancreatitis.  A review of their health history was completed.   A review of medications was also completed.  Any needed refills; refills for Crestor, metformin and synthroid   Falls/  MVA accidents in past few months:none  Regular exercise: not since foot surgery  Specialist pt sees on regular basis: no  Preventative health issues were discussed.   Screenings: Last mammogram 2019; colonoscopy at 69; low-dose CT lung cancer screening  Additional concerns: lower back issues; has had back pain for a while but kind of went away. Aggravated by carrying or changing the linen.    Review of Systems  Constitutional: Positive for fatigue. Negative for activity change, appetite change, chills and fever.  HENT: Negative for ear pain, sinus pain and sneezing.   Respiratory: Negative for cough, shortness of breath and wheezing.   Cardiovascular: Negative for chest pain, palpitations and leg swelling.  Gastrointestinal: Negative for abdominal pain, blood in stool, constipation, diarrhea and vomiting.  Genitourinary: Negative for difficulty urinating, dysuria, frequency, pelvic pain, vaginal bleeding and vaginal discharge.  Musculoskeletal: Negative for back pain and  myalgias.  Neurological: Negative for dizziness, syncope, weakness and light-headedness.       Objective:   Physical Exam Exam conducted with a chaperone present.  Constitutional:      Appearance: Normal appearance.  Neck:     Thyroid: No thyroid mass or thyromegaly.  Cardiovascular:     Rate and Rhythm: Normal rate and regular rhythm.     Pulses: Normal pulses.     Heart sounds: Normal heart sounds.  Pulmonary:     Effort: Pulmonary effort is normal.     Breath sounds: Normal breath sounds.  Chest:     Chest wall: No mass, deformity, swelling or tenderness.     Breasts:        Right: Normal. No swelling, bleeding, inverted nipple or mass.        Left: Normal. No swelling, bleeding, inverted nipple or mass.  Abdominal:     Palpations: Abdomen is soft. There is no mass.     Tenderness: There is no abdominal tenderness.     Hernia: No hernia is present.     Comments: Large area of petechial rash noted  in mid to lower right abdominal region. Pt states it has been there since her last pregnancy, no changes. Nontender.  Genitourinary:    Comments: External: No rashes or lesions Internal: Some vaginal atrophy noted, no lesion/rashes present, no tenderness or obvious masses during bimanual exam, exam limited due to abdominal girth Lymphadenopathy:     Cervical: No cervical adenopathy.     Upper Body:     Right upper body: No supraclavicular, axillary or pectoral adenopathy.     Left upper body: No supraclavicular,  axillary or pectoral adenopathy.  Skin:    General: Skin is warm and dry.  Neurological:     Mental Status: She is alert and oriented to person, place, and time.  Psychiatric:        Mood and Affect: Mood normal.        Behavior: Behavior normal.     Diabetic Foot Exam - Simple   Simple Foot Form Visual Inspection No deformities, no ulcerations, no other skin breakdown bilaterally: Yes Sensation Testing Intact to touch and monofilament testing bilaterally: Yes  Pulse Check Posterior Tibialis and Dorsalis pulse intact bilaterally: Yes Comments    Results for orders placed or performed in visit on 02/27/19  Comprehensive metabolic panel  Result Value Ref Range   Glucose 142 (H) 65 - 99 mg/dL   BUN 19 8 - 27 mg/dL   Creatinine, Ser 1.12 (H) 0.57 - 1.00 mg/dL   GFR calc non Af Amer 54 (L) >59 mL/min/1.73   GFR calc Af Amer 62 >59 mL/min/1.73   BUN/Creatinine Ratio 17 12 - 28   Sodium 142 134 - 144 mmol/L   Potassium 4.7 3.5 - 5.2 mmol/L   Chloride 104 96 - 106 mmol/L   CO2 23 20 - 29 mmol/L   Calcium 9.5 8.7 - 10.3 mg/dL   Total Protein 6.6 6.0 - 8.5 g/dL   Albumin 4.7 3.8 - 4.9 g/dL   Globulin, Total 1.9 1.5 - 4.5 g/dL   Albumin/Globulin Ratio 2.5 (H) 1.2 - 2.2   Bilirubin Total 0.3 0.0 - 1.2 mg/dL   Alkaline Phosphatase 61 39 - 117 IU/L   AST 27 0 - 40 IU/L   ALT 36 (H) 0 - 32 IU/L  Lipid panel  Result Value Ref Range   Cholesterol, Total 157 100 - 199 mg/dL   Triglycerides 256 (H) 0 - 149 mg/dL   HDL 38 (L) >39 mg/dL   VLDL Cholesterol Cal 42 (H) 5 - 40 mg/dL   LDL Chol Calc (NIH) 77 0 - 99 mg/dL   Lipid Comment: CANCELED    Chol/HDL Ratio 4.1 0.0 - 4.4 ratio  Hemoglobin A1c  Result Value Ref Range   Hgb A1c MFr Bld 6.4 (H) 4.8 - 5.6 %   Est. average glucose Bld gHb Est-mCnc 137 mg/dL  VITAMIN D 25 Hydroxy (Vit-D Deficiency, Fractures)  Result Value Ref Range   Vit D, 25-Hydroxy 24.6 (L) 30.0 - 100.0 ng/mL         Assessment & Plan:  1. Encounter for wellness examination in adult - Ambulatory referral to Gastroenterology  2. Need for vaccination - Flu Vaccine QUAD 6+ mos PF IM (Fluarix Quad PF)  3. Type 2 diabetes mellitus without complication, without long-term current use of insulin (HCC) - Urine Microalbumin w/creat. ratio  4. Screening mammogram, encounter for - MM DIGITAL SCREENING BILATERAL  5. Screen for colon cancer - Ambulatory referral to Gastroenterology   -Mammogram and colonscopy referral  ordered. Pt meets requirement for low-dose CT screening for lung cancer; referral requested. Flu vaccine given. Labs reviewed with patient. Patient agree to start consistently taking omega-3 to help decrease triglyceride levels. Additionally, pt will restart Vitamin D to increase her levels. Education given about GLP-1 medications to help lower A1c. Patient will research about the medication and inform us if it something she is willing to try. Dietary and lifestyle education provided. Pt agrees to increase physical activity through walking. Plan recheck lipids and  hemoglobin A1C in 3 months. Also, plan to check  TSH. Email sent to Jene Every, RN for referral for low dose CT scan of the chest.  Return in about 3 months (around 08/07/2019) for Diabetes/Lipids Check.

## 2019-05-08 NOTE — Progress Notes (Signed)
   Subjective:    Patient ID: Latasha Lindsey, female    DOB: 1956/02/10, 63 y.o.   MRN: [id number]  HPI The patient comes in today for a wellness visit.    A review of their health history was completed.  A review of medications was also completed.  Any needed refills; refills for Crestor, metformin and synthroid  Eating habits: healthy  Falls/  MVA accidents in past few months:none  Regular exercise: not since foot surgery  Specialist pt sees on regular basis: no  Preventative health issues were discussed.   Additional concerns: lower back issues; has had back pain for a while but kind of went away. Aggravated by carrying or changing the linen. Pt states she is also very fatigue at all times   Review of Systems     Objective:   Physical Exam        Assessment & Plan:

## 2019-05-08 NOTE — Patient Instructions (Signed)
-  The class of medication we discussed today is called GLP-1. Examples include Victoza, Omzempic, and Trulicity.

## 2019-05-09 LAB — MICROALBUMIN / CREATININE URINE RATIO
Creatinine, Urine: 127.2 mg/dL
Microalb/Creat Ratio: 7 mg/g creat (ref 0–29)
Microalbumin, Urine: 8.9 ug/mL

## 2019-05-14 ENCOUNTER — Other Ambulatory Visit (HOSPITAL_COMMUNITY): Payer: Self-pay | Admitting: *Deleted

## 2019-05-14 ENCOUNTER — Encounter (HOSPITAL_COMMUNITY): Payer: Self-pay | Admitting: *Deleted

## 2019-05-14 DIAGNOSIS — Z87891 Personal history of nicotine dependence: Secondary | ICD-10-CM

## 2019-05-14 DIAGNOSIS — Z122 Encounter for screening for malignant neoplasm of respiratory organs: Secondary | ICD-10-CM

## 2019-05-14 NOTE — Progress Notes (Signed)
Received referral for initial lung cancer screening scan.  Contacted patient and obtained smoking history (started age 63, former smoker quit 10 years ago, 45 pack year) as well as answering questions related to the screening process.  Patient denies signs/symptoms of lung cancer such as weight loss or hemoptysis.  Patient denies comorbidity that would prevent curative treatment if lung cancer were to be found.  Patient is scheduled for shared decision making visit and CT scan on 05/26/19 at 0830.

## 2019-05-15 ENCOUNTER — Ambulatory Visit (HOSPITAL_COMMUNITY): Payer: BLUE CROSS/BLUE SHIELD

## 2019-05-20 ENCOUNTER — Ambulatory Visit (HOSPITAL_COMMUNITY): Payer: BLUE CROSS/BLUE SHIELD

## 2019-05-21 ENCOUNTER — Ambulatory Visit (HOSPITAL_COMMUNITY)
Admission: RE | Admit: 2019-05-21 | Discharge: 2019-05-21 | Disposition: A | Discharge status: Home / Self Care | Payer: PRIVATE HEALTH INSURANCE | Source: Ambulatory Visit | Attending: Nurse Practitioner | Admitting: Nurse Practitioner

## 2019-05-21 ENCOUNTER — Other Ambulatory Visit: Payer: Self-pay

## 2019-05-21 DIAGNOSIS — Z1231 Encounter for screening mammogram for malignant neoplasm of breast: Secondary | ICD-10-CM | POA: Diagnosis not present

## 2019-05-26 ENCOUNTER — Inpatient Hospital Stay (HOSPITAL_COMMUNITY): Payer: PRIVATE HEALTH INSURANCE | Attending: Hematology | Admitting: Nurse Practitioner

## 2019-05-26 ENCOUNTER — Other Ambulatory Visit: Payer: Self-pay

## 2019-05-26 ENCOUNTER — Ambulatory Visit (HOSPITAL_COMMUNITY)
Admission: RE | Admit: 2019-05-26 | Discharge: 2019-05-26 | Disposition: A | Discharge status: Home / Self Care | Payer: PRIVATE HEALTH INSURANCE | Source: Ambulatory Visit | Attending: Hematology | Admitting: Hematology

## 2019-05-26 ENCOUNTER — Encounter (HOSPITAL_COMMUNITY): Payer: Self-pay | Admitting: *Deleted

## 2019-05-26 DIAGNOSIS — Z87891 Personal history of nicotine dependence: Secondary | ICD-10-CM | POA: Insufficient documentation

## 2019-05-26 DIAGNOSIS — Z122 Encounter for screening for malignant neoplasm of respiratory organs: Secondary | ICD-10-CM | POA: Insufficient documentation

## 2019-05-26 NOTE — Assessment & Plan Note (Signed)
1.  Tobacco abuse: -This patient meets the criteria for low-dose CT lung screening. -She is asymptomatic for any signs or symptoms of lung cancer. -The shared decision making visit discussion including risk and benefits of screening, potential for follow-up, diagnostic testing for abnormal scans, potential for false positive, over diagnosis, and discussion about total radiation exposure. -The patient is willing to undergo diagnostics and treatment as needed. -She is counseled on continuing her smoking cessation to decrease her risk for lung cancer, pulmonary disease, heart disease and stroke, she has been tobacco free for 10 years now. -She will present for her LD CT scan today and follow-up with her PCP. -She started smoking at the age of 26 and smoked all the way through 2010.  She smoked about a pack and a half a day.  She has been stopped now for 10 years.

## 2019-05-26 NOTE — Progress Notes (Signed)
[place], [place] [address]   CLINIC:  Medical Oncology/Hematology  PCP:  Mikey Kirschner, [place] [address] [phone]   REASON FOR VISIT: Follow-up for lung cancer screening   INTERVAL HISTORY:  Latasha Lindsey 63 y.o. female is here today for a shared decision making visit for lung cancer screening.  Patient states she started smoking at the age of 45 and continued smoking through 2010.  She smoked about a pack and a half a day.  She has been tobacco free for 10 years now.  She denies any signs or symptoms of lung cancer.  She denies any shortness of breath.  She denies any hemoptysis.  She denies any chest pain, lightheadedness or dizziness.  Patient reports her appetite is stable and she has not lost any weight.    REVIEW OF SYSTEMS:  Review of Systems  All other systems reviewed and are negative.    PAST MEDICAL/SURGICAL HISTORY:  Past Medical History:  Diagnosis Date  . Colon polyp   . Dysrhythmia    palpitations  . Hyperlipidemia   . Hypothyroidism   . Prediabetes   . Thyroid disease   . Vertigo    Past Surgical History:  Procedure Laterality Date  . ABDOMINAL HYSTERECTOMY    . ACHILLES TENDON SURGERY Right 08/21/2017   Procedure: ACHILLES TENDON REPAIR;  Surgeon: Tyson Babinski, DPM;  Location: [facility];  Service: Podiatry;  Laterality: Right;  . HEEL SPUR RESECTION Right 08/21/2017   Procedure: HEEL SPUR RESECTION - HAGLUND'S;  Surgeon: Tyson Babinski, DPM;  Location: [facility];  Service: Podiatry;  Laterality: Right;     SOCIAL HISTORY:  Social History   Socioeconomic History  . Marital status: Divorced    Spouse name: Not on file  . Number of children: Not on file  . Years of education: Not on file  . Highest education level: Not on file  Occupational History  . Not on file  Social Needs  . Financial resource strain: Not on file  . Food insecurity    Worry: Not on file   Inability: Not on file  . Transportation needs    Medical: Not on file    Non-medical: Not on file  Tobacco Use  . Smoking status: Former Smoker    Packs/day: 1.00    Years: 30.00    Pack years: 30.00    Types: Cigarettes    Quit date: 10/13/2009    Years since quitting: 9.6  . Smokeless tobacco: Never Used  Substance and Sexual Activity  . Alcohol use: Yes    Alcohol/week: 1.0 standard drinks    Types: 1 Standard drinks or equivalent per week  . Drug use: No  . Sexual activity: Yes    Birth control/protection: Surgical  Lifestyle  . Physical activity    Days per week: Not on file    Minutes per session: Not on file  . Stress: Not on file  Relationships  . Social Herbalist on phone: Not on file    Gets together: Not on file    Attends religious service: Not on file    Active member of club or organization: Not on file    Attends meetings of clubs or organizations: Not on file    Relationship status: Not on file  . Intimate partner violence    Fear of current or ex partner: Not on file    Emotionally abused: Not  on file    Physically abused: Not on file    Forced sexual activity: Not on file  Other Topics Concern  . Not on file  Social History Narrative  . Not on file    FAMILY HISTORY:  Family History  Problem Relation Age of Onset  . Heart disease Father   . Hyperlipidemia Mother     CURRENT MEDICATIONS:  Outpatient Encounter Medications as of 05/26/2019  Medication Sig  . Cholecalciferol (VITAMIN D3) 1000 units CAPS Take 1,000 Units by mouth daily.  Marland Kitchen ibuprofen (ADVIL,MOTRIN) 200 MG tablet Take 400 mg by mouth every 6 (six) hours as needed for headache or moderate pain.  Marland Kitchen levothyroxine (SYNTHROID) 75 MCG tablet One po qd  . metFORMIN (GLUCOPHAGE) 500 MG tablet Take 1 tablet twice a day with meals  . naproxen sodium (ALEVE) 220 MG tablet Take 220-440 mg by mouth 2 (two) times daily as needed (for pain or headache).  . Omega-3 Fatty Acids (FISH  OIL) 1200 MG CAPS Take 1,200 mg by mouth daily.  . pantoprazole (PROTONIX) 40 MG tablet Take 1 tablet (40 mg total) by mouth daily. (Patient not taking: Reported on 05/08/2019)  . rosuvastatin (CRESTOR) 10 MG tablet TAKE 1 TABLET(10 MG) BY MOUTH DAILY FOR CHOLESTEROL   No facility-administered encounter medications on file as of 05/26/2019.     ALLERGIES:  Allergies  Allergen Reactions  . Mucinex [Guaifenesin Er] Rash     PHYSICAL EXAM:  ECOG Performance status: 1   LABORATORY DATA:  I have reviewed the labs as listed.  CBC    Component Value Date/Time   WBC 6.4 05/06/2018 0808   WBC 9.5 08/19/2017 1428   RBC 4.62 05/06/2018 0808   RBC 4.87 08/19/2017 1428   HGB 14.4 05/06/2018 0808   HCT 43.8 05/06/2018 0808   PLT 233 05/06/2018 0808   MCV 95 05/06/2018 0808   MCH 31.2 05/06/2018 0808   MCH 31.2 08/19/2017 1428   MCHC 32.9 05/06/2018 0808   MCHC 31.9 08/19/2017 1428   RDW 12.0 (L) 05/06/2018 0808   LYMPHSABS 1.9 05/06/2018 0808   MONOABS 0.9 08/19/2017 1428   EOSABS 0.3 05/06/2018 0808   BASOSABS 0.1 05/06/2018 0808   CMP Latest Ref Rng & Units 04/13/2019 11/10/2018 05/06/2018  Glucose 65 - 99 mg/dL 142(H) - 161(H)  BUN 8 - 27 mg/dL 19 - 17  Creatinine 0.57 - 1.00 mg/dL 1.12(H) - 0.83  Sodium 134 - 144 mmol/L 142 - 143  Potassium 3.5 - 5.2 mmol/L 4.7 - 4.6  Chloride 96 - 106 mmol/L 104 - 104  CO2 20 - 29 mmol/L 23 - 23  Calcium 8.7 - 10.3 mg/dL 9.5 - 9.0  Total Protein 6.0 - 8.5 g/dL 6.6 6.5 6.5  Total Bilirubin 0.0 - 1.2 mg/dL 0.3 0.3 0.4  Alkaline Phos 39 - 117 IU/L 61 70 61  AST 0 - 40 IU/L 27 23 17   ALT 0 - 32 IU/L 36(H) 29 24    I personally performed a face-to-face visit.  All questions were answered to patient's stated satisfaction. Encouraged patient to call with any new concerns or questions before his next visit to the cancer center and we can certain see him sooner, if needed.     ASSESSMENT & PLAN:   Smoking hx 1.  Tobacco abuse: -This patient  meets the criteria for low-dose CT lung screening. -She is asymptomatic for any signs or symptoms of lung cancer. -The shared decision making visit discussion including risk and  benefits of screening, potential for follow-up, diagnostic testing for abnormal scans, potential for false positive, over diagnosis, and discussion about total radiation exposure. -The patient is willing to undergo diagnostics and treatment as needed. -She is counseled on continuing her smoking cessation to decrease her risk for lung cancer, pulmonary disease, heart disease and stroke, she has been tobacco free for 10 years now. -She will present for her LD CT scan today and follow-up with her PCP. -She started smoking at the age of 53 and smoked all the way through 2010.  She smoked about a pack and a half a day.  She has been stopped now for 10 years.      Orders placed this encounter:  No orders of the defined types were placed in this encounter.     Francene Finders, FNP-C [place] [phone]

## 2019-05-26 NOTE — Progress Notes (Signed)
Patient notified via mail of LDCT l ung cancer screening results with recommendations to follow up in 12 months.  Also notified of incidental findings and need to follow up with PCP.  Patient's referring provider was sent a copy of results.    IMPRESSION: 1. Lung-RADS 2, benign appearance or behavior. Continue annual screening with low-dose chest CT without contrast in 12 months. 2. Emphysema. 3. Coronary artery atherosclerosis.

## 2019-05-26 NOTE — Patient Instructions (Signed)
You were seen today for your shared decision making visit and a low-dose CT scan for lung cancer screening.    

## 2019-09-17 ENCOUNTER — Encounter: Payer: Self-pay | Admitting: Family Medicine

## 2019-10-10 ENCOUNTER — Ambulatory Visit: Payer: 59 | Attending: Internal Medicine

## 2019-10-10 DIAGNOSIS — Z23 Encounter for immunization: Secondary | ICD-10-CM | POA: Insufficient documentation

## 2019-10-10 NOTE — Progress Notes (Signed)
   Covid-19 Vaccination Clinic  Name:  Gwynneth Vandolah    MRN: [id number] DOB: 10/01/1955  10/10/2019  Ms. Schied was observed post Covid-19 immunization for 15 minutes without incidence. She was provided with Vaccine Information Sheet and instruction to access the V-Safe system.   Ms. Sisko was instructed to call 911 with any severe reactions post vaccine: Marland Kitchen Difficulty breathing  . Swelling of your face and throat  . A fast heartbeat  . A bad rash all over your body  . Dizziness and weakness    Immunizations Administered    Name Date Dose VIS Date Route   Pfizer COVID-19 Vaccine 10/10/2019  6:08 PM 0.3 mL 07/24/2019 Intramuscular   Manufacturer: [place]   Lot: [id number]   [place]: [id number]

## 2019-10-20 ENCOUNTER — Telehealth: Payer: Self-pay | Admitting: Family Medicine

## 2019-10-20 DIAGNOSIS — Z79899 Other long term (current) drug therapy: Secondary | ICD-10-CM

## 2019-10-20 DIAGNOSIS — E785 Hyperlipidemia, unspecified: Secondary | ICD-10-CM

## 2019-10-20 DIAGNOSIS — E039 Hypothyroidism, unspecified: Secondary | ICD-10-CM

## 2019-10-20 DIAGNOSIS — E119 Type 2 diabetes mellitus without complications: Secondary | ICD-10-CM

## 2019-10-20 MED ORDER — METFORMIN HCL 500 MG PO TABS: ORAL_TABLET | ORAL | 0 refills | Status: DC

## 2019-10-20 NOTE — Telephone Encounter (Signed)
Tsh met 7 li p liv A1c vit d

## 2019-10-20 NOTE — Telephone Encounter (Signed)
Lab orders placed and refill sent in. Pt is aware

## 2019-10-20 NOTE — Telephone Encounter (Signed)
Pt is needing refill on Metformin. Pt has 6 month follow up with Hoyle Sauer on 3/19.   She would also like lab work ordered for visit.   [facility]

## 2019-10-20 NOTE — Telephone Encounter (Signed)
Last labs completed on 05/08/2019- urine micro 04/13/2019 Vit D, A1C, lipid and Cmp. Please advise. Thank you

## 2019-10-22 LAB — BASIC METABOLIC PANEL
BUN/Creatinine Ratio: 21 (ref 12–28)
BUN: 21 mg/dL (ref 8–27)
CO2: 24 mmol/L (ref 20–29)
Calcium: 9.6 mg/dL (ref 8.7–10.3)
Chloride: 103 mmol/L (ref 96–106)
Creatinine, Ser: 0.98 mg/dL (ref 0.57–1.00)
GFR calc Af Amer: 72 mL/min/{1.73_m2} (ref >59)
GFR calc non Af Amer: 62 mL/min/{1.73_m2} (ref >59)
Glucose: 137 mg/dL — ABNORMAL HIGH (ref 65–99)
Potassium: 5.2 mmol/L (ref 3.5–5.2)
Sodium: 142 mmol/L (ref 134–144)

## 2019-10-22 LAB — LIPID PANEL
Chol/HDL Ratio: 4.2 ratio (ref 0.0–4.4)
Cholesterol, Total: 161 mg/dL (ref 100–199)
HDL: 38 mg/dL — ABNORMAL LOW (ref >39)
LDL Chol Calc (NIH): 82 mg/dL (ref 0–99)
Triglycerides: 250 mg/dL — ABNORMAL HIGH (ref 0–149)
VLDL Cholesterol Cal: 41 mg/dL — ABNORMAL HIGH (ref 5–40)

## 2019-10-22 LAB — HEPATIC FUNCTION PANEL
ALT: 22 IU/L (ref 0–32)
AST: 18 IU/L (ref 0–40)
Albumin: 4.7 g/dL (ref 3.8–4.8)
Alkaline Phosphatase: 68 IU/L (ref 39–117)
Bilirubin Total: 0.4 mg/dL (ref 0.0–1.2)
Bilirubin, Direct: 0.11 mg/dL (ref 0.00–0.40)
Total Protein: 6.9 g/dL (ref 6.0–8.5)

## 2019-10-22 LAB — TSH: TSH: 1.99 u[IU]/mL (ref 0.450–4.500)

## 2019-10-22 LAB — HEMOGLOBIN A1C
Est. average glucose Bld gHb Est-mCnc: 134 mg/dL
Hgb A1c MFr Bld: 6.3 % — ABNORMAL HIGH (ref 4.8–5.6)

## 2019-10-22 LAB — VITAMIN D 25 HYDROXY (VIT D DEFICIENCY, FRACTURES): Vit D, 25-Hydroxy: 31.6 ng/mL (ref 30.0–100.0)

## 2019-10-30 ENCOUNTER — Other Ambulatory Visit: Payer: Self-pay

## 2019-10-30 ENCOUNTER — Ambulatory Visit (INDEPENDENT_AMBULATORY_CARE_PROVIDER_SITE_OTHER): Payer: 59 | Admitting: Nurse Practitioner

## 2019-10-30 DIAGNOSIS — E785 Hyperlipidemia, unspecified: Secondary | ICD-10-CM | POA: Diagnosis not present

## 2019-10-30 DIAGNOSIS — E119 Type 2 diabetes mellitus without complications: Secondary | ICD-10-CM | POA: Diagnosis not present

## 2019-10-30 DIAGNOSIS — E039 Hypothyroidism, unspecified: Secondary | ICD-10-CM

## 2019-10-30 NOTE — Progress Notes (Signed)
Video visit Subjective:    Patient ID: Latasha Lindsey, female    DOB: 05/16/1956, 64 y.o.   MRN: [id number]  HPI Has had current low-dose CT scan for lung cancer screening.  Patient states it was fine except for signs of emphysema.  Quit smoking about 10 years ago. Back troubles/back pain getting worse and interfering with everyday life. Lower part of back. Taking ibuprofen all the time to help with pain.  This has limited her activity. Pt here for 6 month follow up.   Virtual Visit via Video Note  I connected with Latasha Lindsey on 10/30/19 at  1:40 PM EDT by a video enabled telemedicine application and verified that I am speaking with the correct person using two identifiers.  Location: Patient: home Provider: office   I discussed the limitations of evaluation and management by telemedicine and the availability of in person appointments. The patient expressed understanding and agreed to proceed.  History of Present Illness: Presents for follow-up for her chronic health conditions.  Tries to stay active but somewhat limited due to her back pain.  Healthy diet.  States her weight is basically unchanged.  Has not checked her BP outside the office.  No chest pain, unusual shortness of breath coughing or wheezing.  No visual changes.  Has not had her eye exam.  No difficulty speaking or swallowing.  No numbness or weakness of the face arms or legs.   Observations/Objective: Format  Patient present at home Provider present at office Consent for interaction obtained Coronavirus outbreak made virtual visit necessary  Alert, oriented.  Cheerful, mildly anxious affect. Recent Results (from the past 2160 hour(s))  TSH     Status: None   Collection Time: 10/21/19  8:54 AM  Result Value Ref Range   TSH 1.990 0.450 - 4.500 uIU/mL  Basic Metabolic Panel (BMET)     Status: Abnormal   Collection Time: 10/21/19  8:54 AM  Result Value Ref Range   Glucose 137 (H) 65 - 99 mg/dL   BUN 21 8 - 27  mg/dL   Creatinine, Ser 0.98 0.57 - 1.00 mg/dL   GFR calc non Af Amer 62 >59 mL/min/1.73   GFR calc Af Amer 72 >59 mL/min/1.73   BUN/Creatinine Ratio 21 12 - 28   Sodium 142 134 - 144 mmol/L   Potassium 5.2 3.5 - 5.2 mmol/L   Chloride 103 96 - 106 mmol/L   CO2 24 20 - 29 mmol/L   Calcium 9.6 8.7 - 10.3 mg/dL  Lipid Profile     Status: Abnormal   Collection Time: 10/21/19  8:54 AM  Result Value Ref Range   Cholesterol, Total 161 100 - 199 mg/dL   Triglycerides 250 (H) 0 - 149 mg/dL   HDL 38 (L) >39 mg/dL   VLDL Cholesterol Cal 41 (H) 5 - 40 mg/dL   LDL Chol Calc (NIH) 82 0 - 99 mg/dL   Chol/HDL Ratio 4.2 0.0 - 4.4 ratio    Comment:                                   T. Chol/HDL Ratio                                             Men  Women  1/2 Avg.Risk  3.4    3.3                                   Avg.Risk  5.0    4.4                                2X Avg.Risk  9.6    7.1                                3X Avg.Risk 23.4   11.0   Hepatic function panel     Status: None   Collection Time: 10/21/19  8:54 AM  Result Value Ref Range   Total Protein 6.9 6.0 - 8.5 g/dL   Albumin 4.7 3.8 - 4.8 g/dL   Bilirubin Total 0.4 0.0 - 1.2 mg/dL   Bilirubin, Direct 0.11 0.00 - 0.40 mg/dL   Alkaline Phosphatase 68 39 - 117 IU/L   AST 18 0 - 40 IU/L   ALT 22 0 - 32 IU/L  Hemoglobin A1c     Status: Abnormal   Collection Time: 10/21/19  8:54 AM  Result Value Ref Range   Hgb A1c MFr Bld 6.3 (H) 4.8 - 5.6 %    Comment:          Prediabetes: 5.7 - 6.4          Diabetes: >6.4          Glycemic control for adults with diabetes: <7.0    Est. average glucose Bld gHb Est-mCnc 134 mg/dL  Vitamin D (25 hydroxy)     Status: None   Collection Time: 10/21/19  8:54 AM  Result Value Ref Range   Vit D, 25-Hydroxy 31.6 30.0 - 100.0 ng/mL    Comment: Vitamin D deficiency has been defined by the [place] and an Endocrine Society practice guideline as a level of serum  25-OH vitamin D less than 20 ng/mL (1,2). The Endocrine Society went on to further define vitamin D insufficiency as a level between 21 and 29 ng/mL (2). 1. IOM (Institute of Medicine). 2010. Dietary reference    intakes for calcium and D. [place]: The    [employer]. 2. Holick MF, Binkley Kerman, Bischoff-Ferrari HA, et al.    Evaluation, treatment, and prevention of vitamin D    deficiency: an Endocrine Society clinical practice    guideline. JCEM. 2011 Jul; 96(7):1911-30.    Reviewed recent labs with patient.  Assessment and Plan: Problem List Items Addressed This Visit      Endocrine   Hypothyroidism   Type 2 diabetes mellitus without complication, without long-term current use of insulin ([place]) - Primary     Other   Hyperlipidemia      Follow Up Instructions: Due to elevated triglycerides, encourage patient to slowly increase her omega-3 supplement to 2 p.o. twice daily if tolerated.  Continue to work on [employer], activity and weight loss.  Due to the nature of her back pain, encourage patient to make a face-to-face visit for further evaluation.  She agrees to this plan. Return in about 6 months (around 05/01/2020) for physical.    I discussed the assessment and treatment plan with the patient. The patient was provided an opportunity to ask questions and all were answered. The patient  agreed with the plan and demonstrated an understanding of the instructions.   The patient was advised to call back or seek an in-person evaluation if the symptoms worsen or if the condition fails to improve as anticipated.  I provided 15 minutes of non-face-to-face time during this encounter.       Review of Systems     Objective:   Physical Exam        Assessment & Plan:

## 2019-10-31 ENCOUNTER — Encounter: Payer: Self-pay | Admitting: Nurse Practitioner

## 2019-10-31 ENCOUNTER — Ambulatory Visit: Payer: 59 | Attending: Internal Medicine

## 2019-10-31 DIAGNOSIS — Z23 Encounter for immunization: Secondary | ICD-10-CM

## 2019-10-31 NOTE — Progress Notes (Signed)
   Covid-19 Vaccination Clinic  Name:  Latasha Lindsey    MRN: [id number] DOB: 01/02/1956  10/31/2019  Latasha Lindsey was observed post Covid-19 immunization for 15 minutes without incident. She was provided with Vaccine Information Sheet and instruction to access the V-Safe system.   Latasha Lindsey was instructed to call 911 with any severe reactions post vaccine: Marland Kitchen Difficulty breathing  . Swelling of face and throat  . A fast heartbeat  . A bad rash all over body  . Dizziness and weakness   Immunizations Administered    Name Date Dose VIS Date Route   Pfizer COVID-19 Vaccine 10/31/2019  9:38 AM 0.3 mL 07/24/2019 Intramuscular   Manufacturer: [place]   Lot: [id number]   [place]: [id number]

## 2019-11-06 ENCOUNTER — Encounter: Payer: Self-pay | Admitting: Nurse Practitioner

## 2019-11-06 ENCOUNTER — Other Ambulatory Visit: Payer: Self-pay

## 2019-11-06 ENCOUNTER — Ambulatory Visit (INDEPENDENT_AMBULATORY_CARE_PROVIDER_SITE_OTHER): Payer: 59 | Admitting: Nurse Practitioner

## 2019-11-06 VITALS — BP 130/90 | Temp 97.6°F | Wt 210.4 lb

## 2019-11-06 DIAGNOSIS — M545 Low back pain, unspecified: Secondary | ICD-10-CM

## 2019-11-06 DIAGNOSIS — B070 Plantar wart: Secondary | ICD-10-CM | POA: Diagnosis not present

## 2019-11-06 MED ORDER — TIZANIDINE HCL 4 MG PO TABS: 4.0000 mg | ORAL_TABLET | Freq: Three times a day (TID) | ORAL | 0 refills | Status: DC | PRN

## 2019-11-06 MED ORDER — MELOXICAM 15 MG PO TABS: 15.0000 mg | ORAL_TABLET | Freq: Every day | ORAL | 0 refills | Status: DC

## 2019-11-06 NOTE — Progress Notes (Signed)
Subjective:    Patient ID: Latasha Lindsey, female    DOB: [birth year], 64 y.o.   MRN: [id number]  Reports low back pain in lumbar region that has been persisting for approximately 8 weeks. Use of x3 250mg  ibuprofen daily in the morning, as well as additional usage of ibuprofen in evening with minimal relief. Pain "feels like a knot" in her lower back that causes stiffness and has caused decreased activity in daily life.   Back Pain This is a new problem. The current episode started more than 1 month ago. The problem occurs daily. The problem has been waxing and waning since onset. The pain is present in the lumbar spine. The quality of the pain is described as aching. The pain does not radiate. The pain is at a severity of 8/10. The pain is moderate. The pain is worse during the night. The symptoms are aggravated by bending, position and twisting. Stiffness is present at night. Pertinent negatives include no abdominal pain, bladder incontinence, bowel incontinence, chest pain, dysuria, fever, leg pain or pelvic pain. She has tried walking, NSAIDs and bed rest for the symptoms. The treatment provided mild relief.  Also c/o intermittent problems with her hands falling asleep at night. Unassociated with any shoulder or neck pain. No wrist pain. No weakness.     Review of Systems  Constitutional: Negative for fever.  Cardiovascular: Negative for chest pain.  Gastrointestinal: Negative for abdominal pain and bowel incontinence.  Genitourinary: Negative for bladder incontinence, dysuria and pelvic pain.  Musculoskeletal: Positive for back pain. Negative for gait problem, myalgias, neck pain and neck stiffness.       Objective:   Physical Exam Constitutional:      Appearance: Normal appearance. She is obese.  Abdominal:     Palpations: Abdomen is soft.     Tenderness: There is no abdominal tenderness.     Comments: Significant central obesity noted.   Musculoskeletal:        General:  Tenderness present. No deformity or signs of injury.     Right wrist: No swelling (Patient reports tingling to right wrist/hand that wakes her up at night. Not able to be replicated on exam. Negative Phalens maneuver bilaterally. Negative tinel's sign on bilateral wrists ), deformity or crepitus.     Cervical back: Normal.     Thoracic back: Normal.     Lumbar back: Tenderness present. No deformity, signs of trauma, lacerations or bony tenderness.     Comments: Patient reports tenderness and tightness in lower back with twisting and side to side movement. Unable to replicate pain with palpation of lower back. Patient able to bend and touch toes, able to twist and able to move all extremities. Reflexes intact in lower bilateral extremities. Negative straight leg test  Feet:     Left foot:     Skin integrity: Skin integrity normal.     Toenail Condition: Left toenails are normal.     Comments: Left foot noted to have a small plantar wart Skin:    General: Skin is warm and dry.  Neurological:     Mental Status: She is alert and oriented to person, place, and time. Mental status is at baseline.    Normal active ROM of the back with minimal tenderness with lateral movement and rotation. Normal gait. Can get on and off exam table without assistance.        Assessment & Plan:  Acute right-sided low back pain without sciatica  Plantar wart of left  foot  Meds ordered this encounter  Medications  . meloxicam (MOBIC) 15 MG tablet    Sig: Take 1 tablet (15 mg total) by mouth daily. PRN for Back Pain    Dispense:  30 tablet    Refill:  0    Order Specific Question:   Supervising Provider    Answer:   Sallee Lange A [[id number]]  . tiZANidine (ZANAFLEX) 4 MG tablet    Sig: Take 1 tablet (4 mg total) by mouth every 8 (eight) hours as needed for muscle spasms.    Dispense:  30 tablet    Refill:  0    Order Specific Question:   Supervising Provider    Answer:   Sallee Lange A [[id number]]      Discussed placing patient on a muscle relaxer, Zanaflex, for muscle spasms and stiffness and Mobic for antiinflammatory with discontinuation of ibuprofen use, patient agreeable with plan of care. Patient counseled on the importance of proper supportive footwear, TENS unit usage, OTC Lidocaine patches, and non pharmacological interventions of massage therapy, Ice/heat therapy, and stretching. Information provided for massage therapy and stretching exercises. Patient's concerns about right wrist tingling was addressed as well, patient instructed to wear a wrist brace during sleep and see if that eases the discomfort and tingling as patient showed no signs of Carpal tunnel. Patient also counseled regarding plantar wart noted on left foot, and encouraged if irritation worsens, will refer to podiatry due to T2DM hx. Defers referral at this time.  Contact office in 2-3 weeks if no improvement in her back pain or if new symptoms develop.

## 2019-11-06 NOTE — Patient Instructions (Signed)
Stop Ibuprofen use and take Meloxicam for back pain and inflammation  Consider massage therapy for back pain and spasms  Plantar Wart: Utilize Duct Tape on the foot at night to cause irritation.  Wrist Brace: Use a supportive wrist brace at night on right arm for support and follow up regarding pain and tingling.    Low Back Sprain or Strain Rehab Ask your health care provider which exercises are safe for you. Do exercises exactly as told by your health care provider and adjust them as directed. It is normal to feel mild stretching, pulling, tightness, or discomfort as you do these exercises. Stop right away if you feel sudden pain or your pain gets worse. Do not begin these exercises until told by your health care provider. Stretching and range-of-motion exercises These exercises warm up your muscles and joints and improve the movement and flexibility of your back. These exercises also help to relieve pain, numbness, and tingling. Lumbar rotation  1. Lie on your back on a firm surface and bend your knees. 2. Straighten your arms out to your sides so each arm forms a 90-degree angle (right angle) with a side of your body. 3. Slowly move (rotate) both of your knees to one side of your body until you feel a stretch in your lower back (lumbar). Try not to let your shoulders lift off the floor. 4. Hold this position for __________ seconds. 5. Tense your abdominal muscles and slowly move your knees back to the starting position. 6. Repeat this exercise on the other side of your body. Repeat __________ times. Complete this exercise __________ times a day. Single knee to chest  1. Lie on your back on a firm surface with both legs straight. 2. Bend one of your knees. Use your hands to move your knee up toward your chest until you feel a gentle stretch in your lower back and buttock. ? Hold your leg in this position by holding on to the front of your knee. ? Keep your other leg as straight as  possible. 3. Hold this position for __________ seconds. 4. Slowly return to the starting position. 5. Repeat with your other leg. Repeat __________ times. Complete this exercise __________ times a day. Prone extension on elbows  1. Lie on your abdomen on a firm surface (prone position). 2. Prop yourself up on your elbows. 3. Use your arms to help lift your chest up until you feel a gentle stretch in your abdomen and your lower back. ? This will place some of your body weight on your elbows. If this is uncomfortable, try stacking pillows under your chest. ? Your hips should stay down, against the surface that you are lying on. Keep your hip and back muscles relaxed. 4. Hold this position for __________ seconds. 5. Slowly relax your upper body and return to the starting position. Repeat __________ times. Complete this exercise __________ times a day. Strengthening exercises These exercises build strength and endurance in your back. Endurance is the ability to use your muscles for a long time, even after they get tired. Pelvic tilt This exercise strengthens the muscles that lie deep in the abdomen. 1. Lie on your back on a firm surface. Bend your knees and keep your feet flat on the floor. 2. Tense your abdominal muscles. Tip your pelvis up toward the ceiling and flatten your lower back into the floor. ? To help with this exercise, you may place a small towel under your lower back and try to  push your back into the towel. 3. Hold this position for __________ seconds. 4. Let your muscles relax completely before you repeat this exercise. Repeat __________ times. Complete this exercise __________ times a day. Alternating arm and leg raises  1. Get on your hands and knees on a firm surface. If you are on a hard floor, you may want to use padding, such as an exercise mat, to cushion your knees. 2. Line up your arms and legs. Your hands should be directly below your shoulders, and your knees  should be directly below your hips. 3. Lift your left leg behind you. At the same time, raise your right arm and straighten it in front of you. ? Do not lift your leg higher than your hip. ? Do not lift your arm higher than your shoulder. ? Keep your abdominal and back muscles tight. ? Keep your hips facing the ground. ? Do not arch your back. ? Keep your balance carefully, and do not hold your breath. 4. Hold this position for __________ seconds. 5. Slowly return to the starting position. 6. Repeat with your right leg and your left arm. Repeat __________ times. Complete this exercise __________ times a day. Abdominal set with straight leg raise  1. Lie on your back on a firm surface. 2. Bend one of your knees and keep your other leg straight. 3. Tense your abdominal muscles and lift your straight leg up, 4-6 inches (10-15 cm) off the ground. 4. Keep your abdominal muscles tight and hold this position for __________ seconds. ? Do not hold your breath. ? Do not arch your back. Keep it flat against the ground. 5. Keep your abdominal muscles tense as you slowly lower your leg back to the starting position. 6. Repeat with your other leg. Repeat __________ times. Complete this exercise __________ times a day. Single leg lower with bent knees 1. Lie on your back on a firm surface. 2. Tense your abdominal muscles and lift your feet off the floor, one foot at a time, so your knees and hips are bent in 90-degree angles (right angles). ? Your knees should be over your hips and your lower legs should be parallel to the floor. 3. Keeping your abdominal muscles tense and your knee bent, slowly lower one of your legs so your toe touches the ground. 4. Lift your leg back up to return to the starting position. ? Do not hold your breath. ? Do not let your back arch. Keep your back flat against the ground. 5. Repeat with your other leg. Repeat __________ times. Complete this exercise __________ times a  day. Posture and body mechanics Good posture and healthy body mechanics can help to relieve stress in your body's tissues and joints. Body mechanics refers to the movements and positions of your body while you do your daily activities. Posture is part of body mechanics. Good posture means:  Your spine is in its natural S-curve position (neutral).  Your shoulders are pulled back slightly.  Your head is not tipped forward. Follow these guidelines to improve your posture and body mechanics in your everyday activities. Standing   When standing, keep your spine neutral and your feet about hip width apart. Keep a slight bend in your knees. Your ears, shoulders, and hips should line up.  When you do a task in which you stand in one place for a long time, place one foot up on a stable object that is 2-4 inches (5-10 cm) high, such as a  footstool. This helps keep your spine neutral. Sitting   When sitting, keep your spine neutral and keep your feet flat on the floor. Use a footrest, if necessary, and keep your thighs parallel to the floor. Avoid rounding your shoulders, and avoid tilting your head forward.  When working at a desk or a computer, keep your desk at a height where your hands are slightly lower than your elbows. Slide your chair under your desk so you are close enough to maintain good posture.  When working at a computer, place your monitor at a height where you are looking straight ahead and you do not have to tilt your head forward or downward to look at the screen. Resting  When lying down and resting, avoid positions that are most painful for you.  If you have pain with activities such as sitting, bending, stooping, or squatting, lie in a position in which your body does not bend very much. For example, avoid curling up on your side with your arms and knees near your chest (fetal position).  If you have pain with activities such as standing for a long time or reaching with your  arms, lie with your spine in a neutral position and bend your knees slightly. Try the following positions: ? Lying on your side with a pillow between your knees. ? Lying on your back with a pillow under your knees. Lifting   When lifting objects, keep your feet at least shoulder width apart and tighten your abdominal muscles.  Bend your knees and hips and keep your spine neutral. It is important to lift using the strength of your legs, not your back. Do not lock your knees straight out.  Always ask for help to lift heavy or awkward objects. This information is not intended to replace advice given to you by your health care provider. Make sure you discuss any questions you have with your health care provider. Document Revised: 11/21/2018 Document Reviewed: 08/21/2018 Elsevier Patient Education  [place].

## 2019-11-06 NOTE — Progress Notes (Signed)
   Subjective:    Patient ID: Latasha Lindsey, female    DOB: October 05, 1955, 64 y.o.   MRN: [id number]  Back Pain This is a new problem. The current episode started more than 1 year ago. Episode frequency: lower back pain most of the time; middle part of back comes and goes. Pain location: lower back  The quality of the pain is described as aching (sore). Radiates to: pt had sciatica issues a couple years ago  Exacerbated by: movement, bending. She has tried nothing for the symptoms.      Review of Systems  Musculoskeletal: Positive for back pain.       Objective:   Physical Exam        Assessment & Plan:

## 2019-11-07 ENCOUNTER — Encounter: Payer: Self-pay | Admitting: Nurse Practitioner

## 2020-01-27 ENCOUNTER — Other Ambulatory Visit: Payer: Self-pay

## 2020-01-27 ENCOUNTER — Ambulatory Visit: Payer: 59 | Admitting: Nurse Practitioner

## 2020-01-27 ENCOUNTER — Encounter: Payer: Self-pay | Admitting: Nurse Practitioner

## 2020-01-27 VITALS — BP 124/72 | Temp 98.2°F | Ht 63.5 in | Wt 208.0 lb

## 2020-01-27 DIAGNOSIS — E785 Hyperlipidemia, unspecified: Secondary | ICD-10-CM | POA: Diagnosis not present

## 2020-01-27 DIAGNOSIS — E039 Hypothyroidism, unspecified: Secondary | ICD-10-CM

## 2020-01-27 DIAGNOSIS — E119 Type 2 diabetes mellitus without complications: Secondary | ICD-10-CM | POA: Diagnosis not present

## 2020-01-27 DIAGNOSIS — S90852A Superficial foreign body, left foot, initial encounter: Secondary | ICD-10-CM

## 2020-01-27 LAB — POCT GLYCOSYLATED HEMOGLOBIN (HGB A1C): Hemoglobin A1C: 5.4 % (ref 4.0–5.6)

## 2020-01-27 MED ORDER — ROSUVASTATIN CALCIUM 10 MG PO TABS: ORAL_TABLET | ORAL | 1 refills | Status: DC

## 2020-01-27 MED ORDER — LEVOTHYROXINE SODIUM 75 MCG PO TABS: ORAL_TABLET | ORAL | 1 refills | Status: DC

## 2020-01-27 MED ORDER — METFORMIN HCL 500 MG PO TABS: ORAL_TABLET | ORAL | 0 refills | Status: DC

## 2020-01-27 NOTE — Patient Instructions (Signed)
Freestyle Libre

## 2020-01-27 NOTE — Progress Notes (Signed)
° °  Subjective:    Patient ID: Latasha Lindsey, female    DOB: 1956/01/17, 64 y.o.   MRN: [id number]  HPI needs forms filled out for insurance.  Adherent to medication regimen. Taking Omega 3, three 1200 mg capsules per day. Has lost 4 lbs since last visit. Limited activity. No CP/ischemic type pain or unusual SOB. No edema.    Thinks a piece of metal is stuck in bottom of left foot. Happened a couple of days ago.  Her partner does metal work and patient was walking barefoot. A piece of metal was stuck in the bottom of her left foot. Her partner picked it out with tweezers and a knife. Was better for a couple of days but now sore and she feels there is still something left in the foot.     Review of Systems     Objective:   Physical Exam NAD. Alert, oriented. Cheerful, calm affect. Lungs clear. Heart RRR.  Left foot no erythema or significant edema. No obvious foreign body but slight linear discolored area on the lateral plantar aspect left foot. A 25 g sterile needle and forceps were used to open up area superficially. One small piece of possible debris removed. No pain during procedure with very minimal bleeding.   Results for orders placed or performed in visit on 01/27/20  POCT glycosylated hemoglobin (Hb A1C)  Result Value Ref Range   Hemoglobin A1C 5.4 4.0 - 5.6 %   HbA1c POC (<> result, manual entry)     HbA1c, POC (prediabetic range)     HbA1c, POC (controlled diabetic range)          Assessment & Plan:   Problem List Items Addressed This Visit      Endocrine   Hypothyroidism   Relevant Medications   levothyroxine (SYNTHROID) 75 MCG tablet   Type 2 diabetes mellitus without complication, without long-term current use of insulin ([facility]) - Primary   Relevant Medications   rosuvastatin (CRESTOR) 10 MG tablet   metFORMIN (GLUCOPHAGE) 500 MG tablet   Other Relevant Orders   POCT glycosylated hemoglobin (Hb A1C) (Completed)     Other   Hyperlipidemia   Relevant  Medications   rosuvastatin (CRESTOR) 10 MG tablet    Other Visit Diagnoses    Foreign body of skin of plantar aspect of left foot         A1C continues to improve. Continue current medications as directed. Forms filled out for insurance. It is unclear whether there is a foreign body still in the foot.  Call back if foot pain persists or if signs of infection. Return in about 3 months (around 04/28/2020). To include labs at that time.

## 2020-02-12 ENCOUNTER — Encounter: Payer: Self-pay | Admitting: Nurse Practitioner

## 2020-02-12 ENCOUNTER — Other Ambulatory Visit: Payer: Self-pay | Admitting: Nurse Practitioner

## 2020-02-18 ENCOUNTER — Encounter: Payer: Self-pay | Admitting: Nurse Practitioner

## 2020-02-18 MED ORDER — FLUCONAZOLE 150 MG PO TABS: ORAL_TABLET | ORAL | 0 refills | Status: DC

## 2020-03-30 ENCOUNTER — Telehealth: Payer: Self-pay | Admitting: Family Medicine

## 2020-03-30 NOTE — Telephone Encounter (Signed)
Pt has cpe with Dr. Lovena Le 9/25 and would like to know if lab work is needed.

## 2020-03-30 NOTE — Telephone Encounter (Signed)
Last labs 10/21/19: tsh, met 7, lipid, liver, a1c and vit d.

## 2020-03-31 ENCOUNTER — Other Ambulatory Visit: Payer: Self-pay | Admitting: *Deleted

## 2020-03-31 DIAGNOSIS — R5383 Other fatigue: Secondary | ICD-10-CM

## 2020-03-31 DIAGNOSIS — Z79899 Other long term (current) drug therapy: Secondary | ICD-10-CM

## 2020-03-31 DIAGNOSIS — E1140 Type 2 diabetes mellitus with diabetic neuropathy, unspecified: Secondary | ICD-10-CM

## 2020-03-31 DIAGNOSIS — E785 Hyperlipidemia, unspecified: Secondary | ICD-10-CM

## 2020-03-31 DIAGNOSIS — E119 Type 2 diabetes mellitus without complications: Secondary | ICD-10-CM

## 2020-03-31 DIAGNOSIS — E039 Hypothyroidism, unspecified: Secondary | ICD-10-CM

## 2020-03-31 NOTE — Telephone Encounter (Signed)
Yes, pls order those labs. Thx. Dr. Lovena Le

## 2020-03-31 NOTE — Telephone Encounter (Signed)
Labs ordered, patient notified

## 2020-04-02 ENCOUNTER — Other Ambulatory Visit: Payer: Self-pay | Admitting: Nurse Practitioner

## 2020-04-21 LAB — BASIC METABOLIC PANEL
BUN/Creatinine Ratio: 18 (ref 12–28)
BUN: 16 mg/dL (ref 8–27)
CO2: 26 mmol/L (ref 20–29)
Calcium: 9.5 mg/dL (ref 8.7–10.3)
Chloride: 104 mmol/L (ref 96–106)
Creatinine, Ser: 0.89 mg/dL (ref 0.57–1.00)
GFR calc Af Amer: 81 mL/min/{1.73_m2} (ref >59)
GFR calc non Af Amer: 70 mL/min/{1.73_m2} (ref >59)
Glucose: 143 mg/dL — ABNORMAL HIGH (ref 65–99)
Potassium: 5.2 mmol/L (ref 3.5–5.2)
Sodium: 143 mmol/L (ref 134–144)

## 2020-04-21 LAB — LIPID PANEL
Chol/HDL Ratio: 4.1 ratio (ref 0.0–4.4)
Cholesterol, Total: 150 mg/dL (ref 100–199)
HDL: 37 mg/dL — ABNORMAL LOW (ref >39)
LDL Chol Calc (NIH): 76 mg/dL (ref 0–99)
Triglycerides: 222 mg/dL — ABNORMAL HIGH (ref 0–149)
VLDL Cholesterol Cal: 37 mg/dL (ref 5–40)

## 2020-04-21 LAB — HEPATIC FUNCTION PANEL
ALT: 37 IU/L — ABNORMAL HIGH (ref 0–32)
AST: 25 IU/L (ref 0–40)
Albumin: 4.6 g/dL (ref 3.8–4.8)
Alkaline Phosphatase: 69 IU/L (ref 48–121)
Bilirubin Total: 0.4 mg/dL (ref 0.0–1.2)
Bilirubin, Direct: 0.13 mg/dL (ref 0.00–0.40)
Total Protein: 6.9 g/dL (ref 6.0–8.5)

## 2020-04-21 LAB — HEMOGLOBIN A1C
Est. average glucose Bld gHb Est-mCnc: 140 mg/dL
Hgb A1c MFr Bld: 6.5 % — ABNORMAL HIGH (ref 4.8–5.6)

## 2020-04-21 LAB — TSH: TSH: 2.1 u[IU]/mL (ref 0.450–4.500)

## 2020-04-21 LAB — VITAMIN D 25 HYDROXY (VIT D DEFICIENCY, FRACTURES): Vit D, 25-Hydroxy: 37.6 ng/mL (ref 30.0–100.0)

## 2020-04-24 ENCOUNTER — Other Ambulatory Visit: Payer: Self-pay | Admitting: Nurse Practitioner

## 2020-05-04 ENCOUNTER — Other Ambulatory Visit (HOSPITAL_COMMUNITY): Payer: Self-pay

## 2020-05-04 DIAGNOSIS — Z87891 Personal history of nicotine dependence: Secondary | ICD-10-CM

## 2020-05-04 DIAGNOSIS — Z122 Encounter for screening for malignant neoplasm of respiratory organs: Secondary | ICD-10-CM

## 2020-05-04 NOTE — Progress Notes (Signed)
Attempted to contact patient for follow-up LDCT scheduling. Unable to reach patient at this time. VM left requesting the patient return my phone call to schedule.

## 2020-05-09 ENCOUNTER — Ambulatory Visit (INDEPENDENT_AMBULATORY_CARE_PROVIDER_SITE_OTHER): Payer: 59 | Admitting: Family Medicine

## 2020-05-09 ENCOUNTER — Encounter: Payer: Self-pay | Admitting: Family Medicine

## 2020-05-09 ENCOUNTER — Other Ambulatory Visit: Payer: Self-pay

## 2020-05-09 VITALS — BP 122/84 | HR 71 | Temp 96.9°F | Ht 63.0 in | Wt 211.8 lb

## 2020-05-09 DIAGNOSIS — E119 Type 2 diabetes mellitus without complications: Secondary | ICD-10-CM | POA: Diagnosis not present

## 2020-05-09 DIAGNOSIS — Z1211 Encounter for screening for malignant neoplasm of colon: Secondary | ICD-10-CM

## 2020-05-09 DIAGNOSIS — Z23 Encounter for immunization: Secondary | ICD-10-CM | POA: Diagnosis not present

## 2020-05-09 DIAGNOSIS — Z0000 Encounter for general adult medical examination without abnormal findings: Secondary | ICD-10-CM | POA: Diagnosis not present

## 2020-05-09 DIAGNOSIS — Z1231 Encounter for screening mammogram for malignant neoplasm of breast: Secondary | ICD-10-CM

## 2020-05-09 DIAGNOSIS — E785 Hyperlipidemia, unspecified: Secondary | ICD-10-CM

## 2020-05-09 NOTE — Progress Notes (Signed)
Patient ID: Latasha Lindsey, female    DOB: 1956-01-22, 64 y.o.   MRN: [id number]   Chief Complaint  Patient presents with  . Annual Exam   Subjective:    HPI   The patient comes in today for a wellness visit.  A review of their health history was completed.  A review of medications was also completed.  Any needed refills; none  Eating habits: "chow hound";   Falls/  MVA accidents in past few months: none  Regular exercise: unable to exercise due to multiple aches and pains (different places)  Specialist pt sees on regular basis: none  Preventative health issues were discussed.   Additional concerns: Metformin is causing some issues with eggs going right through her. Pt also states that since she has been on Meftormin she has felt worse. Pt missed her Levothyroxine over the weekend and was researching what she needed to do; she states that she saw something were she could get of medical certification to not pay for med.   Had covid in 8/21.  Had it for about 2 wks. Smell still not back. Had both vaccines. Pt is due to colonoscopy. Had to get new insurance with [employer].   Not sure if metformin.  Having pains "hurting all the time." Ankles is hurting or weak. Or hand pain. occ diarrhea in mornings or eating eggs. 2x per week having diarrhea. Had went out of town and forgot her thyroid medication for 2 days. [facility] $10 levothyroxine 90 days.   Dm2- Compliant with medications. Checking blood glucose.   Not seeing any high or low numbers.  Denies polyuria or polydipsia.  Eye exam: not up to date Foot exam: no concerns.  Hypothyroid- stable, no SE meds. Missed 2 doses last weekend.  Normally compliant with meds.  Medical History Latasha Lindsey has a past medical history of Colon polyp, Dysrhythmia, Hyperlipidemia, Hypothyroidism, Prediabetes, Thyroid disease, and Vertigo.   Outpatient Encounter Medications as of 05/09/2020  Medication Sig  . Cholecalciferol (VITAMIN  D3) 1000 units CAPS Take 1,000 Units by mouth daily.  Marland Kitchen ibuprofen (ADVIL,MOTRIN) 200 MG tablet Take 400 mg by mouth every 6 (six) hours as needed for headache or moderate pain.  Marland Kitchen levothyroxine (SYNTHROID) 75 MCG tablet Take 1 tablet (75 mcg total) by mouth daily.  . metFORMIN (GLUCOPHAGE) 500 MG tablet TAKE 1 TABLET BY MOUTH TWICE DAILY WITH MEALS  . Omega-3 Fatty Acids (FISH OIL) 1200 MG CAPS Take 1,200 mg by mouth daily.  . rosuvastatin (CRESTOR) 10 MG tablet TAKE 1 TABLET(10 MG) BY MOUTH DAILY FOR CHOLESTEROL  . [DISCONTINUED] fluconazole (DIFLUCAN) 150 MG tablet Take one tablet po 3 days apart   No facility-administered encounter medications on file as of 05/09/2020.     Review of Systems  Constitutional: Negative for chills and fever.  HENT: Negative for congestion, rhinorrhea and sore throat.   Respiratory: Negative for cough, shortness of breath and wheezing.   Cardiovascular: Negative for chest pain and leg swelling.  Gastrointestinal: Negative for abdominal pain, diarrhea, nausea and vomiting.  Genitourinary: Negative for dysuria and frequency.  Musculoskeletal: Negative for arthralgias and back pain.  Skin: Negative for rash.  Neurological: Negative for dizziness, weakness and headaches.     Vitals BP 122/84   Pulse 71   Temp (!) 96.9 F (36.1 C)   Ht 5\' 3"  (1.6 m)   Wt 211 lb 12.8 oz (96.1 kg)   SpO2 99%   BMI 37.52 kg/m   Objective:  Physical Exam Vitals and nursing note reviewed.  Constitutional:      General: She is not in acute distress.    Appearance: Normal appearance. She is not ill-appearing.  HENT:     Head: Normocephalic and atraumatic.     Right Ear: Tympanic membrane normal.     Left Ear: Tympanic membrane normal.     Nose: Nose normal. No congestion.     Mouth/Throat:     Mouth: Mucous membranes are moist.     Pharynx: Oropharynx is clear. No oropharyngeal exudate or posterior oropharyngeal erythema.  Eyes:     Extraocular Movements:  Extraocular movements intact.     Conjunctiva/sclera: Conjunctivae normal.     Pupils: Pupils are equal, round, and reactive to light.  Cardiovascular:     Rate and Rhythm: Normal rate and regular rhythm.     Pulses: Normal pulses.     Heart sounds: Normal heart sounds.  Pulmonary:     Effort: Pulmonary effort is normal.     Breath sounds: Normal breath sounds. No wheezing, rhonchi or rales.  Abdominal:     General: Abdomen is flat. Bowel sounds are normal. There is no distension.     Palpations: Abdomen is soft. There is no mass.     Tenderness: There is no abdominal tenderness. There is no guarding or rebound.     Hernia: No hernia is present.  Musculoskeletal:        General: Normal range of motion.     Cervical back: Normal range of motion.     Right lower leg: No edema.     Left lower leg: No edema.  Skin:    General: Skin is warm and dry.     Findings: No lesion or rash.  Neurological:     General: No focal deficit present.     Mental Status: She is alert and oriented to person, place, and time.     Cranial Nerves: No cranial nerve deficit.  Psychiatric:        Mood and Affect: Mood normal.        Behavior: Behavior normal.      Assessment and Plan   1. Well woman exam (no gynecological exam)  2. Screen for colon cancer - Ambulatory referral to Gastroenterology  3. Type 2 diabetes mellitus without complication, without long-term current use of insulin ([place]) - Ambulatory referral to Ophthalmology  4. Need for vaccination - Flu Vaccine QUAD 6+ mos PF IM (Fluarix Quad PF)  5. Screening mammogram, encounter for - MM Digital Screening  6. Hyperlipidemia, unspecified hyperlipidemia type   Hypothyroid- normal, stable, cont meds.  dm2- stable, cont meds. a1c inc rom 5.4 to 6.5, however, still in good control.  Advising to watch carb intake. If still having some GI upset may consider switching to glipizide on next visit.    Pt advised to find medical form she is  requesting and bring to office.   Pt stating can afford her thyroid medications at this time.  HM- ordered referral for DM eye exam, mammo, and referral to GI for colonoscopy screen.  HLD- stable, cont meds. F/u 22mo or prn.

## 2020-05-18 ENCOUNTER — Other Ambulatory Visit (HOSPITAL_COMMUNITY): Payer: Self-pay | Admitting: Family Medicine

## 2020-05-18 DIAGNOSIS — Z1231 Encounter for screening mammogram for malignant neoplasm of breast: Secondary | ICD-10-CM

## 2020-05-20 ENCOUNTER — Encounter: Payer: Self-pay | Admitting: Family Medicine

## 2020-05-23 ENCOUNTER — Ambulatory Visit (HOSPITAL_COMMUNITY)
Admission: RE | Admit: 2020-05-23 | Discharge: 2020-05-23 | Disposition: A | Discharge status: Home / Self Care | Payer: 59 | Source: Ambulatory Visit | Attending: Family Medicine | Admitting: Family Medicine

## 2020-05-23 ENCOUNTER — Other Ambulatory Visit: Payer: Self-pay

## 2020-05-23 DIAGNOSIS — Z1231 Encounter for screening mammogram for malignant neoplasm of breast: Secondary | ICD-10-CM | POA: Diagnosis not present

## 2020-05-24 ENCOUNTER — Telehealth (HOSPITAL_COMMUNITY): Payer: Self-pay | Admitting: Hematology

## 2020-05-26 ENCOUNTER — Ambulatory Visit: Payer: 59

## 2020-06-02 ENCOUNTER — Ambulatory Visit: Payer: 59 | Attending: Internal Medicine

## 2020-06-02 DIAGNOSIS — Z23 Encounter for immunization: Secondary | ICD-10-CM

## 2020-06-02 NOTE — Progress Notes (Signed)
° °  Covid-19 Vaccination Clinic  Name:  Latasha Lindsey    MRN: [id number] DOB: 1955/10/23  06/02/2020  Ms. Latasha Lindsey was observed post Covid-19 immunization for 15 minutes without incident. She was provided with Vaccine Information Sheet and instruction to access the V-Safe system.   Ms. Latasha Lindsey was instructed to call 911 with any severe reactions post vaccine:  Difficulty breathing   Swelling of face and throat   A fast heartbeat   A bad rash all over body   Dizziness and weakness

## 2020-06-16 ENCOUNTER — Ambulatory Visit (HOSPITAL_COMMUNITY)
Admission: RE | Admit: 2020-06-16 | Discharge: 2020-06-16 | Disposition: A | Discharge status: Home / Self Care | Payer: 59 | Source: Ambulatory Visit | Attending: Nurse Practitioner | Admitting: Nurse Practitioner

## 2020-06-16 ENCOUNTER — Other Ambulatory Visit: Payer: Self-pay

## 2020-06-16 DIAGNOSIS — Z122 Encounter for screening for malignant neoplasm of respiratory organs: Secondary | ICD-10-CM | POA: Insufficient documentation

## 2020-06-16 DIAGNOSIS — Z87891 Personal history of nicotine dependence: Secondary | ICD-10-CM | POA: Insufficient documentation

## 2020-06-17 ENCOUNTER — Encounter (HOSPITAL_COMMUNITY): Payer: Self-pay

## 2020-06-17 NOTE — Progress Notes (Signed)
Patient notified of LDCT Lung Cancer Screening Results via mail with the recommendation to follow-up in 12 months. Patient's referring provider has been sent a copy of results. Results are as follows:  IMPRESSION: 1. Lung-RADS 2, benign appearance or behavior. Continue annual screening with low-dose chest CT without contrast in 12 months. 2. Hepatic steatosis. 3. Aortic Atherosclerosis (ICD10-I70.0) and Emphysema (ICD10-J43.9).

## 2020-06-21 ENCOUNTER — Telehealth: Payer: Self-pay | Admitting: Family Medicine

## 2020-06-21 NOTE — Telephone Encounter (Signed)
Pt contacted and verbalized understanding. Pt states that [facility] usually reaches out to her.   Vicente Males, LPN  [facility] Clinical Pool See message below for pt. Pls call pt with results.   Thx.   Dr. Lovena Le       Previous Messages   ----- Message -----  From: Erven Colla, DO  Sent: 06/17/2020 10:53 PM EDT  To: Garwin [employer] Dishmon, RN   Results-chest CT   Pls let pt know from the low dose CT of chest-  That they saw some emphysema changes to the lungs.  Aortic atherosclerosis from elevated cholesterol.  And they recommend repeat ct in 12 months.  No concerns on CT at this time of malignancy.   Take care,   Dr. Lovena Le  ----- Message -----  From: Celestia Khat, RN  Sent: 06/17/2020  9:55 AM EDT  To: Erven Colla, DO   Good morning.   Mrs. Keng had a recent LDCT and the results are as follows:   IMPRESSION:  1. Lung-RADS 2, benign appearance or behavior. Continue annual  screening with low-dose chest CT without contrast in 12 months.  2. Hepatic steatosis.  3. Aortic Atherosclerosis (ICD10-I70.0) and Emphysema (ICD10-J43.9).   I will reach out to her to schedule repeat LDCT in 1 year.   Thank you,  Lilia Pro

## 2020-07-29 ENCOUNTER — Other Ambulatory Visit: Payer: Self-pay | Admitting: Nurse Practitioner

## 2020-08-10 ENCOUNTER — Other Ambulatory Visit: Payer: Self-pay | Admitting: Nurse Practitioner

## 2020-09-13 ENCOUNTER — Encounter: Payer: Self-pay | Admitting: Family Medicine

## 2020-09-13 DIAGNOSIS — R9389 Abnormal findings on diagnostic imaging of other specified body structures: Secondary | ICD-10-CM | POA: Insufficient documentation

## 2020-10-05 ENCOUNTER — Other Ambulatory Visit: Payer: Self-pay | Admitting: *Deleted

## 2020-10-05 DIAGNOSIS — E039 Hypothyroidism, unspecified: Secondary | ICD-10-CM

## 2020-10-05 DIAGNOSIS — E1140 Type 2 diabetes mellitus with diabetic neuropathy, unspecified: Secondary | ICD-10-CM

## 2020-10-05 DIAGNOSIS — E559 Vitamin D deficiency, unspecified: Secondary | ICD-10-CM

## 2020-10-05 DIAGNOSIS — E785 Hyperlipidemia, unspecified: Secondary | ICD-10-CM

## 2020-10-05 DIAGNOSIS — Z79899 Other long term (current) drug therapy: Secondary | ICD-10-CM

## 2020-10-05 NOTE — Telephone Encounter (Signed)
Last labs sept 2021. Lipid, liver, bmp, tsh, vit d and see her note below she said she was over due for a urine test

## 2020-10-19 LAB — LIPID PANEL
Chol/HDL Ratio: 3.7 ratio (ref 0.0–4.4)
Cholesterol, Total: 143 mg/dL (ref 100–199)
HDL: 39 mg/dL — ABNORMAL LOW (ref >39)
LDL Chol Calc (NIH): 72 mg/dL (ref 0–99)
Triglycerides: 191 mg/dL — ABNORMAL HIGH (ref 0–149)
VLDL Cholesterol Cal: 32 mg/dL (ref 5–40)

## 2020-10-19 LAB — HEPATIC FUNCTION PANEL
ALT: 32 IU/L (ref 0–32)
AST: 25 IU/L (ref 0–40)
Albumin: 4.7 g/dL (ref 3.8–4.8)
Alkaline Phosphatase: 68 IU/L (ref 44–121)
Bilirubin Total: 0.3 mg/dL (ref 0.0–1.2)
Bilirubin, Direct: 0.11 mg/dL (ref 0.00–0.40)
Total Protein: 7.2 g/dL (ref 6.0–8.5)

## 2020-10-19 LAB — BASIC METABOLIC PANEL
BUN/Creatinine Ratio: 17 (ref 12–28)
BUN: 18 mg/dL (ref 8–27)
CO2: 23 mmol/L (ref 20–29)
Calcium: 10.1 mg/dL (ref 8.7–10.3)
Chloride: 101 mmol/L (ref 96–106)
Creatinine, Ser: 1.07 mg/dL — ABNORMAL HIGH (ref 0.57–1.00)
Glucose: 161 mg/dL — ABNORMAL HIGH (ref 65–99)
Potassium: 4.9 mmol/L (ref 3.5–5.2)
Sodium: 141 mmol/L (ref 134–144)
eGFR: 59 mL/min/{1.73_m2} — ABNORMAL LOW (ref >59)

## 2020-10-19 LAB — MICROALBUMIN / CREATININE URINE RATIO
Creatinine, Urine: 206.5 mg/dL
Microalb/Creat Ratio: 4 mg/g creat (ref 0–29)
Microalbumin, Urine: 8.3 ug/mL

## 2020-10-19 LAB — VITAMIN D 25 HYDROXY (VIT D DEFICIENCY, FRACTURES): Vit D, 25-Hydroxy: 33 ng/mL (ref 30.0–100.0)

## 2020-10-19 LAB — TSH: TSH: 3.05 u[IU]/mL (ref 0.450–4.500)

## 2020-10-30 ENCOUNTER — Other Ambulatory Visit: Payer: Self-pay | Admitting: Family Medicine

## 2020-10-31 NOTE — Telephone Encounter (Signed)
Lab Results  Component Value Date   HGBA1C 6.5 (H) 04/20/2020    Lab Results  Component Value Date   CREATININE 1.07 (H) 10/18/2020     Lab Results  Component Value Date   CHOL 143 10/18/2020   HDL 39 (L) 10/18/2020   LDLCALC 72 10/18/2020   TRIG 191 (H) 10/18/2020   CHOLHDL 3.7 10/18/2020     BP Readings from Last 3 Encounters:  05/09/20 122/84  01/27/20 124/72  11/06/19 130/90

## 2020-11-07 ENCOUNTER — Encounter: Payer: Self-pay | Admitting: Family Medicine

## 2020-11-07 ENCOUNTER — Ambulatory Visit (INDEPENDENT_AMBULATORY_CARE_PROVIDER_SITE_OTHER): Payer: 59 | Admitting: Family Medicine

## 2020-11-07 ENCOUNTER — Other Ambulatory Visit: Payer: Self-pay

## 2020-11-07 VITALS — BP 132/80 | HR 71 | Temp 97.0°F | Ht 63.0 in | Wt 214.0 lb

## 2020-11-07 DIAGNOSIS — E785 Hyperlipidemia, unspecified: Secondary | ICD-10-CM

## 2020-11-07 DIAGNOSIS — E119 Type 2 diabetes mellitus without complications: Secondary | ICD-10-CM | POA: Diagnosis not present

## 2020-11-07 DIAGNOSIS — E039 Hypothyroidism, unspecified: Secondary | ICD-10-CM

## 2020-11-07 NOTE — Progress Notes (Signed)
Patient ID: Latasha Lindsey, female    DOB: 09/03/1955, 65 y.o.   MRN: [id number]   Chief Complaint  Patient presents with  . Hyperlipidemia  . Diabetes   Subjective:    HPI  med check up on diabetes and cholesterol. Pt states she does not feel good since starting metformin but does not want to change med at this time. Has more body aches since starting metformin and some diarrhea. Within one hour of eating falls asleep for the past 6 months.   Gluc- 161 Trig-191 Last a1c- 6.5. 9/21              tsh-normal.  Does get some diarrhea with the metformin even with eggs and steak.  "cleans her out."  Just retired early in 8/21. Doing a lot of [facility]fri [facility].  Had foot surgery.  Had colonoscopy 10 days ago, had some polyps.  Then they will tell her when she can return.   Medical History Rosaisela has a past medical history of Colon polyp, Dysrhythmia, Hyperlipidemia, Hypothyroidism, Prediabetes, Thyroid disease, and Vertigo.   Outpatient Encounter Medications as of 11/07/2020  Medication Sig  . Cholecalciferol (VITAMIN D3) 1000 units CAPS Take 1,000 Units by mouth daily.  Marland Kitchen ibuprofen (ADVIL,MOTRIN) 200 MG tablet Take 400 mg by mouth every 6 (six) hours as needed for headache or moderate pain.  Marland Kitchen levothyroxine (SYNTHROID) 75 MCG tablet Take 1 tablet (75 mcg total) by mouth daily.  . metFORMIN (GLUCOPHAGE) 500 MG tablet TAKE 1 TABLET BY MOUTH TWICE DAILY WITH MEALS  . Omega-3 Fatty Acids (FISH OIL) 1200 MG CAPS Take 1,200 mg by mouth daily.  . rosuvastatin (CRESTOR) 10 MG tablet TAKE 1 TABLET BY MOUTH ONCE DAILY FOR CHOLESTEROL   No facility-administered encounter medications on file as of 11/07/2020.     Review of Systems  Constitutional: Negative for chills and fever.  HENT: Negative for congestion, rhinorrhea and sore throat.   Respiratory: Negative for cough, shortness of breath and wheezing.   Cardiovascular: Negative for chest pain and leg swelling.   Gastrointestinal: Positive for diarrhea (with the metformin). Negative for abdominal pain, nausea and vomiting.  Genitourinary: Negative for dysuria and frequency.  Musculoskeletal: Negative for arthralgias and back pain.  Skin: Negative for rash.  Neurological: Negative for dizziness, weakness and headaches.     Vitals BP 132/80   Pulse 71   Temp (!) 97 F (36.1 C)   Ht 5\' 3"  (1.6 m)   Wt 214 lb (97.1 kg)   SpO2 97%   BMI 37.91 kg/m   Objective:   Physical Exam Vitals and nursing note reviewed.  Constitutional:      Appearance: Normal appearance.  HENT:     Head: Normocephalic and atraumatic.     Nose: Nose normal.     Mouth/Throat:     Mouth: Mucous membranes are moist.     Pharynx: Oropharynx is clear.  Eyes:     Extraocular Movements: Extraocular movements intact.     Conjunctiva/sclera: Conjunctivae normal.     Pupils: Pupils are equal, round, and reactive to light.  Cardiovascular:     Rate and Rhythm: Normal rate and regular rhythm.     Pulses: Normal pulses.     Heart sounds: Normal heart sounds.  Pulmonary:     Effort: Pulmonary effort is normal.     Breath sounds: Normal breath sounds. No wheezing, rhonchi or rales.  Musculoskeletal:        General: Normal range of  motion.     Right lower leg: No edema.     Left lower leg: No edema.  Skin:    General: Skin is warm and dry.     Findings: No lesion or rash.  Neurological:     General: No focal deficit present.     Mental Status: She is alert and oriented to person, place, and time.  Psychiatric:        Mood and Affect: Mood normal.        Behavior: Behavior normal.      Assessment and Plan   1. Type 2 diabetes mellitus without complication, without long-term current use of insulin (H[facility]) - Hemoglobin A1c  2. Hypothyroidism, unspecified type  3. Hyperlipidemia, unspecified hyperlipidemia type   Pt had eye exam at [facility]- pt stating no retinopathy. Ordered last note. reordered a1c.    DM2- stable. Slight inc from last a1c.  Cont to watch carbs and inc in exercising and cont with metformin.  Advising on next visit if not able to tolerate the metformin could change to metformin XL or another medication.  Pt not wanting to change at this time.  hld- stable. Cont meds.    Return in about 6 months (around 05/10/2021) for f/u dm2, htn, thyroid.   11/07/2020

## 2020-11-13 ENCOUNTER — Other Ambulatory Visit: Payer: Self-pay | Admitting: Nurse Practitioner

## 2020-12-23 ENCOUNTER — Other Ambulatory Visit: Payer: Self-pay | Admitting: Nurse Practitioner

## 2020-12-27 ENCOUNTER — Other Ambulatory Visit: Payer: Self-pay | Admitting: Nurse Practitioner

## 2021-01-26 ENCOUNTER — Other Ambulatory Visit: Payer: Self-pay | Admitting: Family Medicine

## 2021-02-07 ENCOUNTER — Other Ambulatory Visit: Payer: Self-pay | Admitting: Family Medicine

## 2021-03-27 ENCOUNTER — Telehealth: Payer: Self-pay | Admitting: Family Medicine

## 2021-03-27 DIAGNOSIS — Z79899 Other long term (current) drug therapy: Secondary | ICD-10-CM

## 2021-03-27 DIAGNOSIS — E039 Hypothyroidism, unspecified: Secondary | ICD-10-CM

## 2021-03-27 DIAGNOSIS — Z0000 Encounter for general adult medical examination without abnormal findings: Secondary | ICD-10-CM

## 2021-03-27 DIAGNOSIS — E785 Hyperlipidemia, unspecified: Secondary | ICD-10-CM

## 2021-03-27 DIAGNOSIS — E559 Vitamin D deficiency, unspecified: Secondary | ICD-10-CM

## 2021-03-27 DIAGNOSIS — E119 Type 2 diabetes mellitus without complications: Secondary | ICD-10-CM

## 2021-03-27 NOTE — Telephone Encounter (Signed)
Last labs completed 10/18/20 Urine Microalbumin, Vit D, TSH, BMET, Hepatic and Lipid. Please advise. Thank you

## 2021-03-27 NOTE — Telephone Encounter (Signed)
Patient is scheduled for a CPE on 04/18/2021 she would like orders sent and she also needs an A1C.  CB# [phone]

## 2021-03-28 NOTE — Telephone Encounter (Signed)
Updated labs to include A1C(pt has type 2 DM)

## 2021-03-28 NOTE — Telephone Encounter (Signed)
Do you want to order A1C? Please advise. Thank you

## 2021-03-28 NOTE — Telephone Encounter (Signed)
Pt has been contacted and verbalized understanding. Will update orders if A1C ordered.

## 2021-04-12 LAB — HEPATIC FUNCTION PANEL
ALT: 49 IU/L — ABNORMAL HIGH (ref 0–32)
AST: 34 IU/L (ref 0–40)
Albumin: 4.8 g/dL (ref 3.8–4.8)
Alkaline Phosphatase: 86 IU/L (ref 44–121)
Bilirubin Total: 0.3 mg/dL (ref 0.0–1.2)
Bilirubin, Direct: <0.1 mg/dL (ref 0.00–0.40)
Total Protein: 7 g/dL (ref 6.0–8.5)

## 2021-04-12 LAB — BASIC METABOLIC PANEL
BUN/Creatinine Ratio: 13 (ref 12–28)
BUN: 12 mg/dL (ref 8–27)
CO2: 23 mmol/L (ref 20–29)
Calcium: 10 mg/dL (ref 8.7–10.3)
Chloride: 104 mmol/L (ref 96–106)
Creatinine, Ser: 0.95 mg/dL (ref 0.57–1.00)
Glucose: 155 mg/dL — ABNORMAL HIGH (ref 65–99)
Potassium: 4.9 mmol/L (ref 3.5–5.2)
Sodium: 145 mmol/L — ABNORMAL HIGH (ref 134–144)
eGFR: 68 mL/min/{1.73_m2} (ref >59)

## 2021-04-12 LAB — LIPID PANEL
Chol/HDL Ratio: 3.9 ratio (ref 0.0–4.4)
Cholesterol, Total: 146 mg/dL (ref 100–199)
HDL: 37 mg/dL — ABNORMAL LOW (ref >39)
LDL Chol Calc (NIH): 78 mg/dL (ref 0–99)
Triglycerides: 184 mg/dL — ABNORMAL HIGH (ref 0–149)
VLDL Cholesterol Cal: 31 mg/dL (ref 5–40)

## 2021-04-12 LAB — CBC WITH DIFFERENTIAL/PLATELET
Basophils Absolute: 0.1 10*3/uL (ref 0.0–0.2)
Basos: 1 %
EOS (ABSOLUTE): 0.2 10*3/uL (ref 0.0–0.4)
Eos: 2 %
Hematocrit: 44.1 % (ref 34.0–46.6)
Hemoglobin: 14.7 g/dL (ref 11.1–15.9)
Immature Grans (Abs): 0 10*3/uL (ref 0.0–0.1)
Immature Granulocytes: 0 %
Lymphocytes Absolute: 2.4 10*3/uL (ref 0.7–3.1)
Lymphs: 28 %
MCH: 30.9 pg (ref 26.6–33.0)
MCHC: 33.3 g/dL (ref 31.5–35.7)
MCV: 93 fL (ref 79–97)
Monocytes Absolute: 0.8 10*3/uL (ref 0.1–0.9)
Monocytes: 9 %
Neutrophils Absolute: 5 10*3/uL (ref 1.4–7.0)
Neutrophils: 60 %
Platelets: 253 10*3/uL (ref 150–450)
RBC: 4.76 x10E6/uL (ref 3.77–5.28)
RDW: 12.3 % (ref 11.7–15.4)
WBC: 8.4 10*3/uL (ref 3.4–10.8)

## 2021-04-12 LAB — HEMOGLOBIN A1C
Est. average glucose Bld gHb Est-mCnc: 154 mg/dL
Hgb A1c MFr Bld: 7 % — ABNORMAL HIGH (ref 4.8–5.6)

## 2021-04-12 LAB — VITAMIN D 25 HYDROXY (VIT D DEFICIENCY, FRACTURES): Vit D, 25-Hydroxy: 34.8 ng/mL (ref 30.0–100.0)

## 2021-04-12 LAB — TSH: TSH: 2.76 u[IU]/mL (ref 0.450–4.500)

## 2021-04-18 ENCOUNTER — Ambulatory Visit (INDEPENDENT_AMBULATORY_CARE_PROVIDER_SITE_OTHER): Payer: 59 | Admitting: Family Medicine

## 2021-04-18 ENCOUNTER — Encounter: Payer: Self-pay | Admitting: Family Medicine

## 2021-04-18 ENCOUNTER — Other Ambulatory Visit: Payer: Self-pay

## 2021-04-18 VITALS — BP 132/74 | Temp 97.0°F | Ht 63.0 in | Wt 212.2 lb

## 2021-04-18 DIAGNOSIS — M2550 Pain in unspecified joint: Secondary | ICD-10-CM

## 2021-04-18 DIAGNOSIS — E119 Type 2 diabetes mellitus without complications: Secondary | ICD-10-CM | POA: Diagnosis not present

## 2021-04-18 DIAGNOSIS — E039 Hypothyroidism, unspecified: Secondary | ICD-10-CM

## 2021-04-18 DIAGNOSIS — E785 Hyperlipidemia, unspecified: Secondary | ICD-10-CM

## 2021-04-18 DIAGNOSIS — Z1231 Encounter for screening mammogram for malignant neoplasm of breast: Secondary | ICD-10-CM

## 2021-04-18 DIAGNOSIS — Z0000 Encounter for general adult medical examination without abnormal findings: Secondary | ICD-10-CM | POA: Diagnosis not present

## 2021-04-18 MED ORDER — ROSUVASTATIN CALCIUM 10 MG PO TABS: 10.0000 mg | ORAL_TABLET | Freq: Every day | ORAL | 1 refills | Status: DC

## 2021-04-18 MED ORDER — METFORMIN HCL 500 MG PO TABS: 500.0000 mg | ORAL_TABLET | Freq: Two times a day (BID) | ORAL | 1 refills | Status: DC

## 2021-04-18 MED ORDER — LEVOTHYROXINE SODIUM 75 MCG PO TABS: 75.0000 ug | ORAL_TABLET | Freq: Every day | ORAL | 1 refills | Status: DC

## 2021-04-18 NOTE — Progress Notes (Signed)
Patient ID: Latasha Lindsey, female    DOB: 1955/12/17, 65 y.o.   MRN: [id number]   Chief Complaint  Patient presents with   Annual Exam   Subjective:    HPI The patient comes in today for a wellness visit.  A review of their health history was completed.  A review of medications was also completed.  Any needed refills; 76 tabs of Crestor sent in back in June; pt usually gets 90 day  Eating habits: trying to eat better   Falls/  MVA accidents in past few months: none  Regular exercise: walks when she can at [employer]. Goes on Mondays and is rarely able to go back on Tuesday or Wednesday   Specialist pt sees on regular basis: none  Preventative health issues were discussed.   Additional concerns: labwork to check for arthritis(back pain, hand and knee pain)   Had a lot of pain and stiffness in knees and back.  Hands has some pain and cracking knuckles.  Lumbar back pain.  Walking on treadmill and feet hurting badly and legs feel heaviness. Takes a full day from walking 97mn walk, 2.6 mph. Used to walk daily about 5 miles per day. - stopped for a year and had heel surgery. Once diabetes set in- 2020  Hypothyroid- stable. Last tsh 2.7.   Dm2- Compliant with medications. Checking blood glucose.   Not seeing any high or low numbers.  Denies polyuria or polydipsia.  Eye exam: Overdue. Foot exam: no new concerns. Lab Results  Component Value Date   HGBA1C 7.0 (H) 04/11/2021    - a1c 7.0.  has increased a bit, last time at 6.5.  Eating more pasta and eating mini bagels in am. Taking it with the metformin.   Taking extra strength tylenol or aleve prn for pain in joint/back.  Had partial hysterectomy. - still has ovaries.  -hasn't had mammo in while.   Medical History KTayelorhas a past medical history of Colon polyp, Dysrhythmia, Hyperlipidemia, Hypothyroidism, Prediabetes, Thyroid disease, and Vertigo.   Outpatient Encounter Medications as of 04/18/2021   Medication Sig   Cholecalciferol (VITAMIN D3) 1000 units CAPS Take 1,000 Units by mouth daily.   ibuprofen (ADVIL,MOTRIN) 200 MG tablet Take 400 mg by mouth every 6 (six) hours as needed for headache or moderate pain.   Omega-3 Fatty Acids (FISH OIL) 1200 MG CAPS Take 1,200 mg by mouth daily.   [DISCONTINUED] EUTHYROX 75 MCG tablet Take 1 tablet by mouth once daily   [DISCONTINUED] metFORMIN (GLUCOPHAGE) 500 MG tablet TAKE 1 TABLET BY MOUTH TWICE DAILY WITH MEALS   [DISCONTINUED] rosuvastatin (CRESTOR) 10 MG tablet TAKE 1 TABLET BY MOUTH ONCE DAILY FOR CHOLESTEROL   levothyroxine (EUTHYROX) 75 MCG tablet Take 1 tablet (75 mcg total) by mouth daily.   metFORMIN (GLUCOPHAGE) 500 MG tablet Take 1 tablet (500 mg total) by mouth 2 (two) times daily with a meal.   rosuvastatin (CRESTOR) 10 MG tablet Take 1 tablet (10 mg total) by mouth daily. for cholesterol.   No facility-administered encounter medications on file as of 04/18/2021.     Review of Systems  Constitutional:  Negative for chills and fever.  HENT:  Negative for congestion, rhinorrhea and sore throat.   Respiratory:  Negative for cough, shortness of breath and wheezing.   Cardiovascular:  Negative for chest pain and leg swelling.  Gastrointestinal:  Negative for abdominal pain, diarrhea, nausea and vomiting.  Genitourinary:  Negative for dysuria and frequency.  Musculoskeletal:  Positive for arthralgias and back pain.  Skin:  Negative for rash.  Neurological:  Negative for dizziness, weakness and headaches.    Vitals BP 132/74   Temp (!) 97 F (36.1 C)   Ht '5\' 3"'  (1.6 m)   Wt 212 lb 3.2 oz (96.3 kg)   BMI 37.59 kg/m   Objective:   Physical Exam Vitals and nursing note reviewed. Exam conducted with a chaperone present.  Constitutional:      General: She is not in acute distress.    Appearance: Normal appearance. She is not ill-appearing.  HENT:     Head: Normocephalic and atraumatic.     Right Ear: Tympanic membrane  normal.     Left Ear: Tympanic membrane normal.     Nose: Nose normal. No congestion.     Mouth/Throat:     Mouth: Mucous membranes are moist.     Pharynx: Oropharynx is clear. No oropharyngeal exudate or posterior oropharyngeal erythema.  Eyes:     Extraocular Movements: Extraocular movements intact.     Conjunctiva/sclera: Conjunctivae normal.     Pupils: Pupils are equal, round, and reactive to light.  Cardiovascular:     Rate and Rhythm: Normal rate and regular rhythm.     Pulses: Normal pulses.     Heart sounds: Normal heart sounds.  Pulmonary:     Effort: Pulmonary effort is normal.     Breath sounds: Normal breath sounds. No wheezing, rhonchi or rales.  Chest:  Breasts:    Right: Normal. No swelling, bleeding, inverted nipple, mass, nipple discharge, skin change or tenderness.     Left: Normal. No swelling, bleeding, inverted nipple, mass, nipple discharge, skin change or tenderness.  Abdominal:     General: Abdomen is flat. Bowel sounds are normal. There is no distension.     Palpations: Abdomen is soft. There is no mass.     Tenderness: There is no abdominal tenderness. There is no guarding or rebound.     Hernia: No hernia is present.  Musculoskeletal:        General: Normal range of motion.     Cervical back: Normal range of motion.     Right lower leg: No edema.     Left lower leg: No edema.  Lymphadenopathy:     Upper Body:     Right upper body: No supraclavicular, axillary or pectoral adenopathy.     Left upper body: No supraclavicular, axillary or pectoral adenopathy.  Skin:    General: Skin is warm and dry.     Findings: No lesion or rash.  Neurological:     General: No focal deficit present.     Mental Status: She is alert and oriented to person, place, and time.     Cranial Nerves: No cranial nerve deficit.  Psychiatric:        Mood and Affect: Mood normal.        Behavior: Behavior normal.     Assessment and Plan   1. Encounter for well woman exam  without gynecological exam  2. Arthralgia of multiple joints - Rheumatoid Factor; Future - Sed Rate (ESR); Future - C-reactive protein; Future - ANA; Future - ANA - C-reactive protein - Sed Rate (ESR) - Rheumatoid Factor  3. Screening mammogram for breast cancer - MM 3D SCREEN BREAST BILATERAL  4. Type 2 diabetes mellitus without complication, without long-term current use of insulin (HCC) - metFORMIN (GLUCOPHAGE) 500 MG tablet; Take 1 tablet (500 mg total) by mouth 2 (two) times  daily with a meal.  Dispense: 180 tablet; Refill: 1  5. Hypothyroidism, unspecified type - levothyroxine (EUTHYROX) 75 MCG tablet; Take 1 tablet (75 mcg total) by mouth daily.  Dispense: 90 tablet; Refill: 1  6. Hyperlipidemia, unspecified hyperlipidemia type - rosuvastatin (CRESTOR) 10 MG tablet; Take 1 tablet (10 mg total) by mouth daily. for cholesterol.  Dispense: 90 tablet; Refill: 1   Arthralgia-continue to take Tylenol and Aleve as needed heat and ice as needed. Will order labs to evaluate multiple joint pains, patient concerned about rheumatologic arthritis. - wellness exam- vaccines reviewed.  Cont diet and exercising.  Diabetes type 2-controlled, A1c at 7.0.  Continue low-carb diet and increase in exercise.  Continue medications.  Patient to get eye exam.  Hypothyroidism-stable TSH in normal range.  Continue current medication.  Hyperlipidemia-stable.  Continue Crestor.  No follow-ups on file.

## 2021-04-27 LAB — SEDIMENTATION RATE: Sed Rate: 9 mm/hr (ref 0–40)

## 2021-04-27 LAB — ANA: ANA Titer 1: NEGATIVE

## 2021-04-27 LAB — C-REACTIVE PROTEIN: CRP: <1 mg/L (ref 0–10)

## 2021-04-27 LAB — RHEUMATOID FACTOR: Rheumatoid fact SerPl-aCnc: <10 IU/mL (ref <14.0)

## 2021-05-03 ENCOUNTER — Other Ambulatory Visit: Payer: Self-pay | Admitting: Family Medicine

## 2021-05-03 ENCOUNTER — Telehealth: Payer: Self-pay

## 2021-05-03 NOTE — Telephone Encounter (Signed)
Confirmed with patient is currently having slight vaginal itching and clear discharge, advised an appt if ongoing concerns.

## 2021-05-26 ENCOUNTER — Ambulatory Visit (HOSPITAL_COMMUNITY): Payer: 59

## 2021-05-29 ENCOUNTER — Other Ambulatory Visit: Payer: Self-pay

## 2021-05-29 ENCOUNTER — Ambulatory Visit (HOSPITAL_COMMUNITY)
Admission: RE | Admit: 2021-05-29 | Discharge: 2021-05-29 | Disposition: A | Discharge status: Home / Self Care | Payer: 59 | Source: Ambulatory Visit | Attending: Family Medicine | Admitting: Family Medicine

## 2021-05-29 DIAGNOSIS — Z1231 Encounter for screening mammogram for malignant neoplasm of breast: Secondary | ICD-10-CM | POA: Insufficient documentation

## 2021-06-13 ENCOUNTER — Other Ambulatory Visit: Payer: Self-pay | Admitting: *Deleted

## 2021-06-13 DIAGNOSIS — Z122 Encounter for screening for malignant neoplasm of respiratory organs: Secondary | ICD-10-CM

## 2021-06-13 DIAGNOSIS — F17200 Nicotine dependence, unspecified, uncomplicated: Secondary | ICD-10-CM

## 2021-06-30 ENCOUNTER — Other Ambulatory Visit: Payer: Self-pay | Admitting: *Deleted

## 2021-06-30 DIAGNOSIS — Z122 Encounter for screening for malignant neoplasm of respiratory organs: Secondary | ICD-10-CM

## 2021-07-25 ENCOUNTER — Other Ambulatory Visit: Payer: Self-pay | Admitting: Family Medicine

## 2021-07-25 DIAGNOSIS — E785 Hyperlipidemia, unspecified: Secondary | ICD-10-CM

## 2021-07-25 DIAGNOSIS — E119 Type 2 diabetes mellitus without complications: Secondary | ICD-10-CM

## 2021-08-10 ENCOUNTER — Other Ambulatory Visit: Payer: Self-pay | Admitting: *Deleted

## 2021-08-10 DIAGNOSIS — Z122 Encounter for screening for malignant neoplasm of respiratory organs: Secondary | ICD-10-CM

## 2021-08-10 DIAGNOSIS — F17200 Nicotine dependence, unspecified, uncomplicated: Secondary | ICD-10-CM

## 2021-08-31 ENCOUNTER — Encounter: Payer: Self-pay | Admitting: Family Medicine

## 2021-08-31 ENCOUNTER — Telehealth: Payer: Self-pay | Admitting: Family Medicine

## 2021-08-31 ENCOUNTER — Other Ambulatory Visit: Payer: Self-pay | Admitting: Family Medicine

## 2021-08-31 DIAGNOSIS — E039 Hypothyroidism, unspecified: Secondary | ICD-10-CM

## 2021-08-31 DIAGNOSIS — Z130 Encounter for screening for diseases of the blood and blood-forming organs and certain disorders involving the immune mechanism: Secondary | ICD-10-CM

## 2021-08-31 DIAGNOSIS — K760 Fatty (change of) liver, not elsewhere classified: Secondary | ICD-10-CM | POA: Insufficient documentation

## 2021-08-31 DIAGNOSIS — E785 Hyperlipidemia, unspecified: Secondary | ICD-10-CM

## 2021-08-31 DIAGNOSIS — E119 Type 2 diabetes mellitus without complications: Secondary | ICD-10-CM

## 2021-08-31 NOTE — Telephone Encounter (Signed)
Patient has appointment for 3/20 and would like to get her labs done

## 2021-08-31 NOTE — Telephone Encounter (Signed)
Patient informed labs were ordered. Verbalized understanding

## 2021-09-12 ENCOUNTER — Encounter (HOSPITAL_COMMUNITY): Payer: Self-pay

## 2021-09-12 ENCOUNTER — Other Ambulatory Visit (HOSPITAL_COMMUNITY): Payer: Self-pay

## 2021-09-12 DIAGNOSIS — Z122 Encounter for screening for malignant neoplasm of respiratory organs: Secondary | ICD-10-CM

## 2021-09-12 DIAGNOSIS — Z87891 Personal history of nicotine dependence: Secondary | ICD-10-CM

## 2021-09-12 NOTE — Progress Notes (Signed)
Attempted to reach patient regarding follow-up LDCT. Unable to reach patient at this time. Detailed VM left asking that the patient return my call

## 2021-09-12 NOTE — Progress Notes (Signed)
Order placed for LDCT per protocol °

## 2021-09-12 NOTE — Progress Notes (Signed)
LDCT scheduled for 3/9 at 2p. Patient aware.

## 2021-10-13 LAB — CMP14+EGFR
ALT: 40 IU/L — ABNORMAL HIGH (ref 0–32)
AST: 32 IU/L (ref 0–40)
Albumin/Globulin Ratio: 2.2 (ref 1.2–2.2)
Albumin: 5.1 g/dL — ABNORMAL HIGH (ref 3.8–4.8)
Alkaline Phosphatase: 81 IU/L (ref 44–121)
BUN/Creatinine Ratio: 14 (ref 12–28)
BUN: 16 mg/dL (ref 8–27)
Bilirubin Total: 0.4 mg/dL (ref 0.0–1.2)
CO2: 25 mmol/L (ref 20–29)
Calcium: 9.9 mg/dL (ref 8.7–10.3)
Chloride: 103 mmol/L (ref 96–106)
Creatinine, Ser: 1.18 mg/dL — ABNORMAL HIGH (ref 0.57–1.00)
Globulin, Total: 2.3 g/dL (ref 1.5–4.5)
Glucose: 168 mg/dL — ABNORMAL HIGH (ref 70–99)
Potassium: 5.3 mmol/L — ABNORMAL HIGH (ref 3.5–5.2)
Sodium: 144 mmol/L (ref 134–144)
Total Protein: 7.4 g/dL (ref 6.0–8.5)
eGFR: 52 mL/min/{1.73_m2} — ABNORMAL LOW (ref >59)

## 2021-10-13 LAB — LIPID PANEL
Chol/HDL Ratio: 3.9 ratio (ref 0.0–4.4)
Cholesterol, Total: 143 mg/dL (ref 100–199)
HDL: 37 mg/dL — ABNORMAL LOW (ref >39)
LDL Chol Calc (NIH): 75 mg/dL (ref 0–99)
Triglycerides: 182 mg/dL — ABNORMAL HIGH (ref 0–149)
VLDL Cholesterol Cal: 31 mg/dL (ref 5–40)

## 2021-10-13 LAB — CBC
Hematocrit: 45.9 % (ref 34.0–46.6)
Hemoglobin: 15 g/dL (ref 11.1–15.9)
MCH: 30.4 pg (ref 26.6–33.0)
MCHC: 32.7 g/dL (ref 31.5–35.7)
MCV: 93 fL (ref 79–97)
Platelets: 265 10*3/uL (ref 150–450)
RBC: 4.93 x10E6/uL (ref 3.77–5.28)
RDW: 12.5 % (ref 11.7–15.4)
WBC: 7.2 10*3/uL (ref 3.4–10.8)

## 2021-10-13 LAB — TSH: TSH: 7.05 u[IU]/mL — ABNORMAL HIGH (ref 0.450–4.500)

## 2021-10-13 LAB — HEMOGLOBIN A1C
Est. average glucose Bld gHb Est-mCnc: 160 mg/dL
Hgb A1c MFr Bld: 7.2 % — ABNORMAL HIGH (ref 4.8–5.6)

## 2021-10-16 ENCOUNTER — Other Ambulatory Visit: Payer: Self-pay

## 2021-10-16 DIAGNOSIS — E119 Type 2 diabetes mellitus without complications: Secondary | ICD-10-CM

## 2021-10-16 DIAGNOSIS — E785 Hyperlipidemia, unspecified: Secondary | ICD-10-CM

## 2021-10-16 MED ORDER — METFORMIN HCL 500 MG PO TABS: 500.0000 mg | ORAL_TABLET | Freq: Two times a day (BID) | ORAL | 0 refills | Status: DC

## 2021-10-16 MED ORDER — ROSUVASTATIN CALCIUM 10 MG PO TABS: 10.0000 mg | ORAL_TABLET | Freq: Every day | ORAL | 0 refills | Status: DC

## 2021-10-17 ENCOUNTER — Other Ambulatory Visit: Payer: Self-pay | Admitting: Family Medicine

## 2021-10-17 MED ORDER — LEVOTHYROXINE SODIUM 88 MCG PO TABS: 88.0000 ug | ORAL_TABLET | Freq: Every day | ORAL | 3 refills | Status: DC

## 2021-10-19 ENCOUNTER — Other Ambulatory Visit: Payer: Self-pay

## 2021-10-19 ENCOUNTER — Ambulatory Visit (HOSPITAL_COMMUNITY)
Admission: RE | Admit: 2021-10-19 | Discharge: 2021-10-19 | Disposition: A | Discharge status: Home / Self Care | Payer: 59 | Source: Ambulatory Visit | Attending: Physician Assistant | Admitting: Physician Assistant

## 2021-10-19 DIAGNOSIS — Z87891 Personal history of nicotine dependence: Secondary | ICD-10-CM | POA: Insufficient documentation

## 2021-10-19 DIAGNOSIS — Z122 Encounter for screening for malignant neoplasm of respiratory organs: Secondary | ICD-10-CM | POA: Diagnosis present

## 2021-10-20 ENCOUNTER — Telehealth: Payer: Self-pay | Admitting: *Deleted

## 2021-10-20 NOTE — Telephone Encounter (Signed)
Patient had follow up CT from reminder file 10/19/21- has follow up with Dr Lacinda Axon 10/30/21 ?

## 2021-10-23 ENCOUNTER — Other Ambulatory Visit: Payer: Self-pay | Admitting: Family Medicine

## 2021-10-23 DIAGNOSIS — E039 Hypothyroidism, unspecified: Secondary | ICD-10-CM

## 2021-10-30 ENCOUNTER — Other Ambulatory Visit: Payer: Self-pay

## 2021-10-30 ENCOUNTER — Ambulatory Visit (INDEPENDENT_AMBULATORY_CARE_PROVIDER_SITE_OTHER): Payer: 59 | Admitting: Family Medicine

## 2021-10-30 VITALS — BP 140/80 | HR 70 | Temp 98.6°F | Ht 63.0 in | Wt 211.6 lb

## 2021-10-30 DIAGNOSIS — E039 Hypothyroidism, unspecified: Secondary | ICD-10-CM | POA: Diagnosis not present

## 2021-10-30 DIAGNOSIS — E119 Type 2 diabetes mellitus without complications: Secondary | ICD-10-CM

## 2021-10-30 DIAGNOSIS — E785 Hyperlipidemia, unspecified: Secondary | ICD-10-CM

## 2021-10-30 DIAGNOSIS — R7989 Other specified abnormal findings of blood chemistry: Secondary | ICD-10-CM | POA: Diagnosis not present

## 2021-10-30 NOTE — Assessment & Plan Note (Signed)
Most recent A1c 7.2.  We discussed dietary changes and the need to stay active today.  No increase in metformin or additional pharmacotherapy at this time.  We have elected to proceed with dietary and lifestyle changes to get A1c at goal. ?

## 2021-10-30 NOTE — Patient Instructions (Addendum)
Watch diet as we discussed. ? ?Stay active. ? ?Labs in 6 weeks. ? ?Follow up in 6 months. ? ?Take care ? ?Dr. Lacinda Axon  ?

## 2021-10-30 NOTE — Progress Notes (Signed)
? ?Subjective:  ?Patient ID: Latasha Lindsey, female    DOB: 28-Dec-1955  Age: 66 y.o. MRN: [id number] ? ?CC: ?Chief Complaint  ?Patient presents with  ? Follow-up  ?  On medications  ? ? ?HPI: ? ?66 year old female with hepatic steatosis, hypothyroidism, type 2 diabetes, hyperlipidemia presents for follow-up. ? ?Most recent A1c was 7.2.  She is currently on metformin 500 mg twice daily.  We will discuss dietary and lifestyle changes today.  Patient does not wish to increase her metformin due to the fact that it upsets her stomach. ? ?Most recent TSH was 7.050.  Synthroid has been increased to 88 mcg daily. ? ?Hyperlipidemia has been stable on Crestor.  No side effects. ? ?Patient Active Problem List  ? Diagnosis Date Noted  ? Elevated serum creatinine 10/30/2021  ? Hepatic steatosis 08/31/2021  ? Abnormal screening CT of chest 09/13/2020  ? Type 2 diabetes mellitus without complication, without long-term current use of insulin ([place]) 05/08/2019  ? Hypothyroidism 01/11/2015  ? Hyperlipidemia   ? ? ?Social Hx   ?Social History  ? ?Socioeconomic History  ? Marital status: Divorced  ?  Spouse name: Not on file  ? Number of children: Not on file  ? Years of education: Not on file  ? Highest education level: Not on file  ?Occupational History  ? Not on file  ?Tobacco Use  ? Smoking status: Former  ?  Packs/day: 1.00  ?  Years: 30.00  ?  Pack years: 30.00  ?  Types: Cigarettes  ?  Quit date: 10/13/2009  ?  Years since quitting: 12.0  ? Smokeless tobacco: Never  ?Vaping Use  ? Vaping Use: Never used  ?Substance and Sexual Activity  ? Alcohol use: Yes  ?  Alcohol/week: 1.0 standard drink  ?  Types: 1 Standard drinks or equivalent per week  ? Drug use: No  ? Sexual activity: Yes  ?  Birth control/protection: Surgical  ?Other Topics Concern  ? Not on file  ?Social History Narrative  ? Not on file  ? ?Social Determinants of Health  ? ?Financial Resource Strain: Not on file  ?Food Insecurity: Not on file  ?Transportation Needs: Not  on file  ?Physical Activity: Not on file  ?Stress: Not on file  ?Social Connections: Not on file  ? ? ?Review of Systems  ?Constitutional: Negative.   ?Respiratory: Negative.    ? ? ?Objective:  ?BP 140/80   Pulse 70   Temp 98.6 ?F (37 ?C) (Oral)   Ht '5\' 3"'  (1.6 m)   Wt 211 lb 9.6 oz (96 kg)   SpO2 96%   BMI 37.48 kg/m?  ? ?BP/Weight 10/30/2021 04/18/2021 11/07/2020  ?Systolic BP 841 324 401  ?Diastolic BP 80 74 80  ?Wt. (Lbs) 211.6 212.2 214  ?BMI 37.48 37.59 37.91  ? ? ?Physical Exam ?Vitals and nursing note reviewed.  ?Constitutional:   ?   General: She is not in acute distress. ?   Appearance: Normal appearance. She is obese.  ?HENT:  ?   Head: Normocephalic and atraumatic.  ?Eyes:  ?   General:     ?   Right eye: No discharge.     ?   Left eye: No discharge.  ?   Conjunctiva/sclera: Conjunctivae normal.  ?Cardiovascular:  ?   Rate and Rhythm: Normal rate and regular rhythm.  ?Pulmonary:  ?   Effort: Pulmonary effort is normal.  ?   Breath sounds: Normal breath sounds. No wheezing or rales.  ?  Abdominal:  ?   General: There is no distension.  ?   Palpations: Abdomen is soft.  ?   Tenderness: There is no abdominal tenderness.  ?Neurological:  ?   Mental Status: She is alert.  ?Psychiatric:     ?   Mood and Affect: Mood normal.     ?   Behavior: Behavior normal.  ? ? ?Lab Results  ?Component Value Date  ? WBC 7.2 10/12/2021  ? HGB 15.0 10/12/2021  ? HCT 45.9 10/12/2021  ? PLT 265 10/12/2021  ? GLUCOSE 168 (H) 10/12/2021  ? CHOL 143 10/12/2021  ? TRIG 182 (H) 10/12/2021  ? HDL 37 (L) 10/12/2021  ? [place] 75 10/12/2021  ? ALT 40 (H) 10/12/2021  ? AST 32 10/12/2021  ? NA 144 10/12/2021  ? K 5.3 (H) 10/12/2021  ? CL 103 10/12/2021  ? CREATININE 1.18 (H) 10/12/2021  ? BUN 16 10/12/2021  ? CO2 25 10/12/2021  ? TSH 7.050 (H) 10/12/2021  ? HGBA1C 7.2 (H) 10/12/2021  ? ? ? ?Assessment & Plan:  ? ?Problem List Items Addressed This Visit   ? ?  ? Endocrine  ? Hypothyroidism - Primary  ?  Uncontrolled.  I just increased her  Synthroid to 88 mcg daily.  Continue.  Recheck TSH in 6 weeks. ?  ?  ? Relevant Orders  ? TSH  ? Type 2 diabetes mellitus without complication, without long-term current use of insulin ([place])  ?  Most recent A1c 7.2.  We discussed dietary changes and the need to stay active today.  No increase in metformin or additional pharmacotherapy at this time.  We have elected to proceed with dietary and lifestyle changes to get A1c at goal. ?  ?  ? Relevant Orders  ? CMP14+EGFR  ?  ? Other  ? Hyperlipidemia  ?  LDL at goal.  Continue Crestor ?  ?  ? Elevated serum creatinine  ?  Most recent labs revealed elevated creatinine.  Rechecking in 6 weeks. ?  ?  ? ? ?Follow-up:  Return in about 6 months (around 05/02/2022). ? ?Thersa Salt DO ?[place] ? ?

## 2021-10-30 NOTE — Assessment & Plan Note (Signed)
Most recent labs revealed elevated creatinine.  Rechecking in 6 weeks. ?

## 2021-10-30 NOTE — Assessment & Plan Note (Signed)
Uncontrolled.  I just increased her Synthroid to 88 mcg daily.  Continue.  Recheck TSH in 6 weeks. ?

## 2021-10-30 NOTE — Assessment & Plan Note (Signed)
LDL at goal.  Continue Crestor. 

## 2021-12-15 LAB — CMP14+EGFR
ALT: 39 IU/L — ABNORMAL HIGH (ref 0–32)
AST: 31 IU/L (ref 0–40)
Albumin/Globulin Ratio: 2 (ref 1.2–2.2)
Albumin: 4.5 g/dL (ref 3.8–4.8)
Alkaline Phosphatase: 82 IU/L (ref 44–121)
BUN/Creatinine Ratio: 12 (ref 12–28)
BUN: 11 mg/dL (ref 8–27)
Bilirubin Total: 0.3 mg/dL (ref 0.0–1.2)
CO2: 25 mmol/L (ref 20–29)
Calcium: 9.1 mg/dL (ref 8.7–10.3)
Chloride: 104 mmol/L (ref 96–106)
Creatinine, Ser: 0.93 mg/dL (ref 0.57–1.00)
Globulin, Total: 2.2 g/dL (ref 1.5–4.5)
Glucose: 161 mg/dL — ABNORMAL HIGH (ref 70–99)
Potassium: 4.6 mmol/L (ref 3.5–5.2)
Sodium: 143 mmol/L (ref 134–144)
Total Protein: 6.7 g/dL (ref 6.0–8.5)
eGFR: 69 mL/min/{1.73_m2} (ref >59)

## 2021-12-15 LAB — TSH: TSH: 4.87 u[IU]/mL — ABNORMAL HIGH (ref 0.450–4.500)

## 2021-12-17 ENCOUNTER — Other Ambulatory Visit: Payer: Self-pay | Admitting: Family Medicine

## 2021-12-17 MED ORDER — LEVOTHYROXINE SODIUM 100 MCG PO TABS: 100.0000 ug | ORAL_TABLET | Freq: Every day | ORAL | 0 refills | Status: DC

## 2022-02-09 ENCOUNTER — Other Ambulatory Visit: Payer: Self-pay | Admitting: Family Medicine

## 2022-04-05 ENCOUNTER — Encounter: Payer: Self-pay | Admitting: Family Medicine

## 2022-04-05 DIAGNOSIS — Z79899 Other long term (current) drug therapy: Secondary | ICD-10-CM

## 2022-04-05 DIAGNOSIS — E039 Hypothyroidism, unspecified: Secondary | ICD-10-CM

## 2022-04-05 DIAGNOSIS — E119 Type 2 diabetes mellitus without complications: Secondary | ICD-10-CM

## 2022-04-05 DIAGNOSIS — E785 Hyperlipidemia, unspecified: Secondary | ICD-10-CM

## 2022-04-10 DIAGNOSIS — E039 Hypothyroidism, unspecified: Secondary | ICD-10-CM | POA: Diagnosis not present

## 2022-04-10 DIAGNOSIS — E785 Hyperlipidemia, unspecified: Secondary | ICD-10-CM | POA: Diagnosis not present

## 2022-04-10 DIAGNOSIS — Z79899 Other long term (current) drug therapy: Secondary | ICD-10-CM | POA: Diagnosis not present

## 2022-04-10 DIAGNOSIS — E119 Type 2 diabetes mellitus without complications: Secondary | ICD-10-CM | POA: Diagnosis not present

## 2022-04-11 ENCOUNTER — Other Ambulatory Visit: Payer: Self-pay | Admitting: Family Medicine

## 2022-04-11 LAB — CMP14+EGFR
ALT: 40 IU/L — ABNORMAL HIGH (ref 0–32)
AST: 56 IU/L — ABNORMAL HIGH (ref 0–40)
Albumin/Globulin Ratio: 2.3 — ABNORMAL HIGH (ref 1.2–2.2)
Albumin: 4.8 g/dL (ref 3.9–4.9)
Alkaline Phosphatase: 75 IU/L (ref 44–121)
BUN/Creatinine Ratio: 13 (ref 12–28)
BUN: 13 mg/dL (ref 8–27)
Bilirubin Total: 0.5 mg/dL (ref 0.0–1.2)
CO2: 25 mmol/L (ref 20–29)
Calcium: 9.7 mg/dL (ref 8.7–10.3)
Chloride: 103 mmol/L (ref 96–106)
Creatinine, Ser: 0.97 mg/dL (ref 0.57–1.00)
Globulin, Total: 2.1 g/dL (ref 1.5–4.5)
Glucose: 153 mg/dL — ABNORMAL HIGH (ref 70–99)
Potassium: 4.4 mmol/L (ref 3.5–5.2)
Sodium: 143 mmol/L (ref 134–144)
Total Protein: 6.9 g/dL (ref 6.0–8.5)
eGFR: 66 mL/min/{1.73_m2} (ref >59)

## 2022-04-11 LAB — CBC WITH DIFFERENTIAL/PLATELET
Basophils Absolute: 0.1 10*3/uL (ref 0.0–0.2)
Basos: 1 %
EOS (ABSOLUTE): 0.2 10*3/uL (ref 0.0–0.4)
Eos: 3 %
Hematocrit: 46.5 % (ref 34.0–46.6)
Hemoglobin: 14.9 g/dL (ref 11.1–15.9)
Immature Grans (Abs): 0 10*3/uL (ref 0.0–0.1)
Immature Granulocytes: 0 %
Lymphocytes Absolute: 2 10*3/uL (ref 0.7–3.1)
Lymphs: 26 %
MCH: 30.4 pg (ref 26.6–33.0)
MCHC: 32 g/dL (ref 31.5–35.7)
MCV: 95 fL (ref 79–97)
Monocytes Absolute: 0.7 10*3/uL (ref 0.1–0.9)
Monocytes: 9 %
Neutrophils Absolute: 4.7 10*3/uL (ref 1.4–7.0)
Neutrophils: 61 %
Platelets: 263 10*3/uL (ref 150–450)
RBC: 4.9 x10E6/uL (ref 3.77–5.28)
RDW: 12.7 % (ref 11.7–15.4)
WBC: 7.7 10*3/uL (ref 3.4–10.8)

## 2022-04-11 LAB — LIPID PANEL
Chol/HDL Ratio: 4 ratio (ref 0.0–4.4)
Cholesterol, Total: 141 mg/dL (ref 100–199)
HDL: 35 mg/dL — ABNORMAL LOW (ref >39)
LDL Chol Calc (NIH): 67 mg/dL (ref 0–99)
Triglycerides: 239 mg/dL — ABNORMAL HIGH (ref 0–149)
VLDL Cholesterol Cal: 39 mg/dL (ref 5–40)

## 2022-04-11 LAB — HEMOGLOBIN A1C
Est. average glucose Bld gHb Est-mCnc: 166 mg/dL
Hgb A1c MFr Bld: 7.4 % — ABNORMAL HIGH (ref 4.8–5.6)

## 2022-04-11 LAB — TSH: TSH: 3.57 u[IU]/mL (ref 0.450–4.500)

## 2022-05-02 ENCOUNTER — Ambulatory Visit (INDEPENDENT_AMBULATORY_CARE_PROVIDER_SITE_OTHER): Payer: 59 | Admitting: Family Medicine

## 2022-05-02 VITALS — BP 140/90 | HR 66 | Temp 97.7°F | Ht 63.0 in | Wt 207.0 lb

## 2022-05-02 DIAGNOSIS — J439 Emphysema, unspecified: Secondary | ICD-10-CM | POA: Insufficient documentation

## 2022-05-02 DIAGNOSIS — E785 Hyperlipidemia, unspecified: Secondary | ICD-10-CM | POA: Diagnosis not present

## 2022-05-02 DIAGNOSIS — E119 Type 2 diabetes mellitus without complications: Secondary | ICD-10-CM

## 2022-05-02 DIAGNOSIS — E039 Hypothyroidism, unspecified: Secondary | ICD-10-CM

## 2022-05-02 DIAGNOSIS — Z23 Encounter for immunization: Secondary | ICD-10-CM | POA: Diagnosis not present

## 2022-05-02 DIAGNOSIS — R9389 Abnormal findings on diagnostic imaging of other specified body structures: Secondary | ICD-10-CM

## 2022-05-02 MED ORDER — LEVOTHYROXINE SODIUM 100 MCG PO TABS: 100.0000 ug | ORAL_TABLET | Freq: Every day | ORAL | 3 refills | Status: DC

## 2022-05-02 MED ORDER — ROSUVASTATIN CALCIUM 10 MG PO TABS: 10.0000 mg | ORAL_TABLET | Freq: Every day | ORAL | 3 refills | Status: DC

## 2022-05-02 NOTE — Patient Instructions (Signed)
Call [phone] to schedule mammogram.  Consider RSV vaccine and COVID (at pharmacy).  Follow up in 3 months.

## 2022-05-03 NOTE — Assessment & Plan Note (Signed)
Stable.  Continue current dosing of Synthroid. 

## 2022-05-03 NOTE — Progress Notes (Signed)
Subjective:  Patient ID: Latasha Lindsey, female    DOB: 16-Jan-1956  Age: 66 y.o. MRN: [id number]  CC: Chief Complaint  Patient presents with   6 month follow up    Lab results  discuss medications - metformin increase is not tolerated    HPI:  66 year old female with a history of emphysema, hepatic steatosis, hypothyroidism, type 2 diabetes, hyperlipidemia presents for follow-up.  Patient's recent labs reflect good control of LDL.  Triglycerides elevated.  This is likely due to diet and lifestyle as well as elevated hemoglobin A1c.  A1c has risen slightly to 7.4.  She is currently taking metformin 500 mg twice daily.  She states that she cannot tolerate higher doses of metformin due to GI side effects.  Will discuss treatment options today regarding getting her diabetes at goal.  Hypothyroidism stable.  Most recent TSH 3.570.  Compliant with levothyroxine 100 mcg daily.  Patient would like her flu shot today.  Patient Active Problem List   Diagnosis Date Noted   Emphysema lung ([place]) 05/02/2022   Hepatic steatosis 08/31/2021   Abnormal screening CT of chest 09/13/2020   Type 2 diabetes mellitus without complication, without long-term current use of insulin ([place]) 05/08/2019   Hypothyroidism 01/11/2015   Hyperlipidemia     Social Hx   Social History   Socioeconomic History   Marital status: Divorced    Spouse name: Not on file   Number of children: Not on file   Years of education: Not on file   Highest education level: Not on file  Occupational History   Not on file  Tobacco Use   Smoking status: Former    Packs/day: 1.00    Years: 30.00    Total pack years: 30.00    Types: Cigarettes    Quit date: 10/13/2009    Years since quitting: 12.5   Smokeless tobacco: Never  Vaping Use   Vaping Use: Never used  Substance and Sexual Activity   Alcohol use: Yes    Alcohol/week: 1.0 standard drink of alcohol    Types: 1 Standard drinks or equivalent per week   Drug use: No    Sexual activity: Yes    Birth control/protection: Surgical  Other Topics Concern   Not on file  Social History Narrative   Not on file   Social Determinants of Health   Financial Resource Strain: Not on file  Food Insecurity: Not on file  Transportation Needs: Not on file  Physical Activity: Not on file  Stress: Not on file  Social Connections: Not on file    Review of Systems Per HPI  Objective:  BP (!) 140/90   Pulse 66   Temp 97.7 F (36.5 C)   Ht '5\' 3"'$  (1.6 m)   Wt 207 lb (93.9 kg)   SpO2 97%   BMI 36.67 kg/m      05/02/2022   10:47 AM 10/30/2021   10:25 AM 04/18/2021   10:25 AM  BP/Weight  Systolic BP 500 938 182  Diastolic BP 90 80 74  Wt. (Lbs) 207 211.6 212.2  BMI 36.67 kg/m2 37.48 kg/m2 37.59 kg/m2    Physical Exam Vitals and nursing note reviewed.  Constitutional:      General: She is not in acute distress.    Appearance: Normal appearance. She is not ill-appearing.  HENT:     Head: Normocephalic and atraumatic.  Cardiovascular:     Rate and Rhythm: Normal rate and regular rhythm.  Pulmonary:  Effort: Pulmonary effort is normal.     Breath sounds: Normal breath sounds. No wheezing, rhonchi or rales.  Neurological:     Mental Status: She is alert.  Psychiatric:        Mood and Affect: Mood normal.        Behavior: Behavior normal.     Lab Results  Component Value Date   WBC 7.7 04/10/2022   HGB 14.9 04/10/2022   HCT 46.5 04/10/2022   PLT 263 04/10/2022   GLUCOSE 153 (H) 04/10/2022   CHOL 141 04/10/2022   TRIG 239 (H) 04/10/2022   HDL 35 (L) 04/10/2022   LDLCALC 67 04/10/2022   ALT 40 (H) 04/10/2022   AST 56 (H) 04/10/2022   NA 143 04/10/2022   K 4.4 04/10/2022   CL 103 04/10/2022   CREATININE 0.97 04/10/2022   BUN 13 04/10/2022   CO2 25 04/10/2022   TSH 3.570 04/10/2022   HGBA1C 7.4 (H) 04/10/2022     Assessment & Plan:   Problem List Items Addressed This Visit       Endocrine   Hypothyroidism    Stable.   Continue current dosing of Synthroid.      Relevant Medications   levothyroxine (SYNTHROID) 100 MCG tablet   Type 2 diabetes mellitus without complication, without long-term current use of insulin ([facility]) - Primary    Control is worsening.  Discussed working on lifestyle as well as addition of other medication.  We discussed the classes of meds today.  She wants to wait and work on lifestyle changes and follow-up in 3 months.  Continue metformin.      Relevant Medications   rosuvastatin (CRESTOR) 10 MG tablet     Other   Hyperlipidemia    Good control of LDL.  Continue Crestor.      Relevant Medications   rosuvastatin (CRESTOR) 10 MG tablet    Meds ordered this encounter  Medications   rosuvastatin (CRESTOR) 10 MG tablet    Sig: Take 1 tablet (10 mg total) by mouth daily. for cholesterol.    Dispense:  90 tablet    Refill:  3   levothyroxine (SYNTHROID) 100 MCG tablet    Sig: Take 1 tablet (100 mcg total) by mouth daily.    Dispense:  90 tablet    Refill:  3    Follow-up: 3 months  [place] DO [place]

## 2022-05-03 NOTE — Assessment & Plan Note (Signed)
Good control of LDL.  Continue Crestor.

## 2022-05-03 NOTE — Assessment & Plan Note (Signed)
Control is worsening.  Discussed working on lifestyle as well as addition of other medication.  We discussed the classes of meds today.  She wants to wait and work on lifestyle changes and follow-up in 3 months.  Continue metformin.

## 2022-05-04 ENCOUNTER — Other Ambulatory Visit (HOSPITAL_COMMUNITY): Payer: Self-pay | Admitting: Family Medicine

## 2022-05-04 DIAGNOSIS — Z1231 Encounter for screening mammogram for malignant neoplasm of breast: Secondary | ICD-10-CM

## 2022-05-06 ENCOUNTER — Other Ambulatory Visit: Payer: Self-pay | Admitting: Family Medicine

## 2022-05-06 DIAGNOSIS — E119 Type 2 diabetes mellitus without complications: Secondary | ICD-10-CM

## 2022-05-09 ENCOUNTER — Telehealth: Payer: Self-pay

## 2022-05-09 NOTE — Telephone Encounter (Signed)
Caller name:Phoebie Thurnell Garbe   On DPR? :No  Call back number:[phone]  Provider they see: Lacinda Axon   Reason for call:Pt called she come in on 05/02/2022 she is saying that should have been a phy but in the notes it was a 3 to 6 month follow up can the code be changed or does she need to make a phy now?

## 2022-05-09 NOTE — Telephone Encounter (Signed)
Coral Spikes, DO     Have her schedule a physical

## 2022-05-09 NOTE — Telephone Encounter (Signed)
Patient scheduled wellness visit 05/15/22 at 10:40am with Dr Lacinda Axon

## 2022-05-15 ENCOUNTER — Encounter: Payer: Self-pay | Admitting: Family Medicine

## 2022-05-15 ENCOUNTER — Ambulatory Visit (INDEPENDENT_AMBULATORY_CARE_PROVIDER_SITE_OTHER): Payer: 59 | Admitting: Family Medicine

## 2022-05-15 VITALS — BP 128/78 | HR 68 | Temp 98.0°F | Ht 63.0 in | Wt 208.0 lb

## 2022-05-15 DIAGNOSIS — Z23 Encounter for immunization: Secondary | ICD-10-CM | POA: Diagnosis not present

## 2022-05-15 DIAGNOSIS — R21 Rash and other nonspecific skin eruption: Secondary | ICD-10-CM | POA: Insufficient documentation

## 2022-05-15 DIAGNOSIS — E119 Type 2 diabetes mellitus without complications: Secondary | ICD-10-CM

## 2022-05-15 DIAGNOSIS — Z0000 Encounter for general adult medical examination without abnormal findings: Secondary | ICD-10-CM | POA: Diagnosis not present

## 2022-05-15 MED ORDER — TRIAMCINOLONE ACETONIDE 0.5 % EX OINT: 1.0000 | TOPICAL_OINTMENT | Freq: Two times a day (BID) | CUTANEOUS | 0 refills | Status: DC

## 2022-05-15 NOTE — Assessment & Plan Note (Signed)
Treating with triamcinolone. 

## 2022-05-15 NOTE — Progress Notes (Signed)
Subjective:  Patient ID: Latasha Lindsey, female    DOB: 1956-04-18  Age: 66 y.o. MRN: [id number]  CC: Chief Complaint  Patient presents with   Annual Exam    Pt arrives for wellness. Itching/raised area on left upper shoulder chest area    HPI:  66 year old female with COPD, hepatic steatosis, hypothyroidism, type 2 diabetes, and hyperlipidemia presents for an annual exam.  Preventative healthcare Patient is in need of pneumococcal vaccine.  She is okay with getting this today. Patient is also in need of Tdap.  We will get this today as well. Colonoscopy up-to-date. Has upcoming mammogram this month. Needs diabetic kidney evaluation with urine ACR.  We will get today. Patient is overdue for eye exam.  Patient states that she will get this in the near future. Excluding COVID-vaccine, the remainder of her preventative health care is up-to-date including CT lung cancer screening.  Patient also reports that she has had a recent development of a rash around her left clavicle.  She states that this is a recurrent issue.  Mild itching.  Mild excoriation.  No relieving factors.  Patient Active Problem List   Diagnosis Date Noted   Annual physical exam 05/15/2022   Rash 05/15/2022   Emphysema lung ([place]) 05/02/2022   Hepatic steatosis 08/31/2021   Type 2 diabetes mellitus without complication, without long-term current use of insulin ([employer]) 05/08/2019   Hypothyroidism 01/11/2015   Hyperlipidemia     Social Hx   Social History   Socioeconomic History   Marital status: Divorced    Spouse name: Not on file   Number of children: Not on file   Years of education: Not on file   Highest education level: Not on file  Occupational History   Not on file  Tobacco Use   Smoking status: Former    Packs/day: 1.00    Years: 30.00    Total pack years: 30.00    Types: Cigarettes    Quit date: 10/13/2009    Years since quitting: 12.5   Smokeless tobacco: Never  Vaping Use   Vaping Use:  Never used  Substance and Sexual Activity   Alcohol use: Yes    Alcohol/week: 1.0 standard drink of alcohol    Types: 1 Standard drinks or equivalent per week   Drug use: No   Sexual activity: Yes    Birth control/protection: Surgical  Other Topics Concern   Not on file  Social History Narrative   Not on file   Social Determinants of Health   Financial Resource Strain: Not on file  Food Insecurity: Not on file  Transportation Needs: Not on file  Physical Activity: Not on file  Stress: Not on file  Social Connections: Not on file    Review of Systems  Constitutional: Negative.   Skin:  Positive for rash.   Objective:  BP 128/78   Pulse 68   Temp 98 F (36.7 C)   Ht '5\' 3"'$  (1.6 m)   Wt 208 lb (94.3 kg)   SpO2 98%   BMI 36.85 kg/m      05/15/2022   11:06 AM 05/02/2022   10:47 AM 10/30/2021   10:25 AM  BP/Weight  Systolic BP 970 263 785  Diastolic BP 78 90 80  Wt. (Lbs) 208 207 211.6  BMI 36.85 kg/m2 36.67 kg/m2 37.48 kg/m2    Physical Exam Vitals and nursing note reviewed.  Constitutional:      Appearance: Normal appearance. She is obese.  HENT:  Head: Normocephalic and atraumatic.  Eyes:     General:        Right eye: No discharge.        Left eye: No discharge.     Conjunctiva/sclera: Conjunctivae normal.  Cardiovascular:     Rate and Rhythm: Normal rate and regular rhythm.  Pulmonary:     Effort: Pulmonary effort is normal.     Breath sounds: Normal breath sounds. No wheezing, rhonchi or rales.  Skin:    Comments: There are a few scattered papules around the left clavicle.  Neurological:     Mental Status: She is alert.  Psychiatric:        Mood and Affect: Mood normal.        Behavior: Behavior normal.     Lab Results  Component Value Date   WBC 7.7 04/10/2022   HGB 14.9 04/10/2022   HCT 46.5 04/10/2022   PLT 263 04/10/2022   GLUCOSE 153 (H) 04/10/2022   CHOL 141 04/10/2022   TRIG 239 (H) 04/10/2022   HDL 35 (L) 04/10/2022    LDLCALC 67 04/10/2022   ALT 40 (H) 04/10/2022   AST 56 (H) 04/10/2022   NA 143 04/10/2022   K 4.4 04/10/2022   CL 103 04/10/2022   CREATININE 0.97 04/10/2022   BUN 13 04/10/2022   CO2 25 04/10/2022   TSH 3.570 04/10/2022   HGBA1C 7.4 (H) 04/10/2022     Assessment & Plan:   Problem List Items Addressed This Visit       Endocrine   Type 2 diabetes mellitus without complication, without long-term current use of insulin ([place])   Relevant Orders   Microalbumin / creatinine urine ratio     Musculoskeletal and Integument   Rash    Treating with triamcinolone.        Other   Annual physical exam - Primary    Tdap given today.  Pneumococcal vaccine given today.  Patient advised that she needs to get her eye exam done.  Urine ACR today.      Other Visit Diagnoses     Need for vaccination       Relevant Orders   Pneumococcal conjugate vaccine 20-valent (Prevnar 20) (Completed)   Tdap vaccine greater than or equal to 7yo IM (Completed)       Meds ordered this encounter  Medications   triamcinolone ointment (KENALOG) 0.5 %    Sig: Apply 1 Application topically 2 (two) times daily.    Dispense:  30 g    Refill:  0    Follow-up: Has follow up in Dec   Miia Blanks DO [facility]

## 2022-05-15 NOTE — Patient Instructions (Signed)
Use the topical for the rash.  [facility] for urine sample.  Be sure to get mammogram and get your eyes checked.  Take care  Dr. Lacinda Axon

## 2022-05-15 NOTE — Assessment & Plan Note (Signed)
Tdap given today.  Pneumococcal vaccine given today.  Patient advised that she needs to get her eye exam done.  Urine ACR today.

## 2022-05-16 LAB — MICROALBUMIN / CREATININE URINE RATIO
Creatinine, Urine: 41.7 mg/dL
Microalb/Creat Ratio: <7 mg/g creat (ref 0–29)
Microalbumin, Urine: <3 ug/mL

## 2022-06-04 ENCOUNTER — Ambulatory Visit (HOSPITAL_COMMUNITY)
Admission: RE | Admit: 2022-06-04 | Discharge: 2022-06-04 | Disposition: A | Discharge status: Home / Self Care | Payer: 59 | Source: Ambulatory Visit | Attending: Family Medicine | Admitting: Family Medicine

## 2022-06-04 DIAGNOSIS — Z1231 Encounter for screening mammogram for malignant neoplasm of breast: Secondary | ICD-10-CM | POA: Insufficient documentation

## 2022-07-09 DIAGNOSIS — E669 Obesity, unspecified: Secondary | ICD-10-CM | POA: Diagnosis not present

## 2022-07-09 DIAGNOSIS — Z87891 Personal history of nicotine dependence: Secondary | ICD-10-CM | POA: Diagnosis not present

## 2022-07-09 DIAGNOSIS — E039 Hypothyroidism, unspecified: Secondary | ICD-10-CM | POA: Diagnosis not present

## 2022-07-09 DIAGNOSIS — E785 Hyperlipidemia, unspecified: Secondary | ICD-10-CM | POA: Diagnosis not present

## 2022-07-09 DIAGNOSIS — Z6835 Body mass index (BMI) 35.0-35.9, adult: Secondary | ICD-10-CM | POA: Diagnosis not present

## 2022-07-09 DIAGNOSIS — Z7984 Long term (current) use of oral hypoglycemic drugs: Secondary | ICD-10-CM | POA: Diagnosis not present

## 2022-07-09 DIAGNOSIS — E119 Type 2 diabetes mellitus without complications: Secondary | ICD-10-CM | POA: Diagnosis not present

## 2022-07-24 ENCOUNTER — Telehealth: Payer: Self-pay

## 2022-07-24 DIAGNOSIS — E119 Type 2 diabetes mellitus without complications: Secondary | ICD-10-CM

## 2022-07-24 DIAGNOSIS — Z79899 Other long term (current) drug therapy: Secondary | ICD-10-CM

## 2022-07-24 NOTE — Telephone Encounter (Signed)
Last labs completed 05/15/22 Urine Micro;04/10/22 A1C, Lipid, TSH, CMP14+EGFR, CBC. Please advise.thank you

## 2022-07-24 NOTE — Telephone Encounter (Signed)
Caller name: Raelan Burgoon  On DPR?: Yes  Call back number: [phone] (mobile)  Provider they see: Coral Spikes, DO  Reason for call:Pt has appt on the 20th regarding A1c needs blood work ordered

## 2022-07-25 NOTE — Telephone Encounter (Signed)
Labs ordered and pt is aware

## 2022-07-26 DIAGNOSIS — Z79899 Other long term (current) drug therapy: Secondary | ICD-10-CM | POA: Diagnosis not present

## 2022-07-26 DIAGNOSIS — E119 Type 2 diabetes mellitus without complications: Secondary | ICD-10-CM | POA: Diagnosis not present

## 2022-07-27 LAB — COMPREHENSIVE METABOLIC PANEL
ALT: 28 IU/L (ref 0–32)
AST: 34 IU/L (ref 0–40)
Albumin/Globulin Ratio: 2 (ref 1.2–2.2)
Albumin: 4.7 g/dL (ref 3.9–4.9)
Alkaline Phosphatase: 76 IU/L (ref 44–121)
BUN/Creatinine Ratio: 12 (ref 12–28)
BUN: 11 mg/dL (ref 8–27)
Bilirubin Total: 0.4 mg/dL (ref 0.0–1.2)
CO2: 25 mmol/L (ref 20–29)
Calcium: 9.6 mg/dL (ref 8.7–10.3)
Chloride: 101 mmol/L (ref 96–106)
Creatinine, Ser: 0.91 mg/dL (ref 0.57–1.00)
Globulin, Total: 2.3 g/dL (ref 1.5–4.5)
Glucose: 231 mg/dL — ABNORMAL HIGH (ref 70–99)
Potassium: 5.3 mmol/L — ABNORMAL HIGH (ref 3.5–5.2)
Sodium: 140 mmol/L (ref 134–144)
Total Protein: 7 g/dL (ref 6.0–8.5)
eGFR: 70 mL/min/{1.73_m2} (ref >59)

## 2022-07-27 LAB — HEMOGLOBIN A1C
Est. average glucose Bld gHb Est-mCnc: 163 mg/dL
Hgb A1c MFr Bld: 7.3 % — ABNORMAL HIGH (ref 4.8–5.6)

## 2022-08-01 ENCOUNTER — Ambulatory Visit: Payer: 59 | Admitting: Family Medicine

## 2022-08-01 ENCOUNTER — Encounter: Payer: Self-pay | Admitting: Family Medicine

## 2022-08-01 VITALS — BP 138/76 | HR 75 | Temp 98.1°F | Wt 205.4 lb

## 2022-08-01 DIAGNOSIS — E119 Type 2 diabetes mellitus without complications: Secondary | ICD-10-CM

## 2022-08-01 MED ORDER — METFORMIN HCL ER 750 MG PO TB24: 750.0000 mg | ORAL_TABLET | Freq: Two times a day (BID) | ORAL | 1 refills | Status: DC

## 2022-08-01 NOTE — Progress Notes (Signed)
Subjective:  Patient ID: Latasha Lindsey, female    DOB: Aug 02, 1956  Age: 66 y.o. MRN: [id number]  CC: Chief Complaint  Patient presents with   Follow-up    medications    HPI:  66 year old female with emphysema, hepatic steatosis, hypothyroidism, type 2 diabetes, hyperlipidemia presents for follow-up.  Patient presents to discuss most recent labs.  Most recent A1c 7.3.  She is currently on metformin 500 mg twice daily.  She states that she does not tolerate an increase in metformin due to side effects (GI upset, diarrhea).  We will discuss treatment options today to get her A1c better under control.  Patient Active Problem List   Diagnosis Date Noted   Emphysema lung ([place]) 05/02/2022   Hepatic steatosis 08/31/2021   Type 2 diabetes mellitus without complication, without long-term current use of insulin ([place]) 05/08/2019   Hypothyroidism 01/11/2015   Hyperlipidemia     Social Hx   Social History   Socioeconomic History   Marital status: Divorced    Spouse name: Not on file   Number of children: Not on file   Years of education: Not on file   Highest education level: Not on file  Occupational History   Not on file  Tobacco Use   Smoking status: Former    Packs/day: 1.00    Years: 30.00    Total pack years: 30.00    Types: Cigarettes    Quit date: 10/13/2009    Years since quitting: 12.8   Smokeless tobacco: Never  Vaping Use   Vaping Use: Never used  Substance and Sexual Activity   Alcohol use: Yes    Alcohol/week: 1.0 standard drink of alcohol    Types: 1 Standard drinks or equivalent per week   Drug use: No   Sexual activity: Yes    Birth control/protection: Surgical  Other Topics Concern   Not on file  Social History Narrative   Not on file   Social Determinants of Health   Financial Resource Strain: Not on file  Food Insecurity: Not on file  Transportation Needs: Not on file  Physical Activity: Not on file  Stress: Not on file  Social Connections:  Not on file    Review of Systems  Constitutional: Negative.   Musculoskeletal:  Positive for back pain.     Objective:  BP 138/76   Pulse 75   Temp 98.1 F (36.7 C) (Oral)   Wt 205 lb 6.4 oz (93.2 kg)   SpO2 98%   BMI 36.38 kg/m      08/01/2022    9:55 AM 05/15/2022   11:06 AM 05/02/2022   10:47 AM  BP/Weight  Systolic BP 502 774 128  Diastolic BP 76 78 90  Wt. (Lbs) 205.4 208 207  BMI 36.38 kg/m2 36.85 kg/m2 36.67 kg/m2    Physical Exam Vitals and nursing note reviewed.  Constitutional:      General: She is not in acute distress.    Appearance: Normal appearance. She is obese.  HENT:     Head: Normocephalic and atraumatic.  Cardiovascular:     Rate and Rhythm: Normal rate and regular rhythm.  Pulmonary:     Effort: Pulmonary effort is normal.     Breath sounds: Normal breath sounds.  Neurological:     Mental Status: She is alert.  Psychiatric:        Mood and Affect: Mood normal.        Behavior: Behavior normal.     Lab Results  Component Value Date   WBC 7.7 04/10/2022   HGB 14.9 04/10/2022   HCT 46.5 04/10/2022   PLT 263 04/10/2022   GLUCOSE 231 (H) 07/26/2022   CHOL 141 04/10/2022   TRIG 239 (H) 04/10/2022   HDL 35 (L) 04/10/2022   LDLCALC 67 04/10/2022   ALT 28 07/26/2022   AST 34 07/26/2022   NA 140 07/26/2022   K 5.3 (H) 07/26/2022   CL 101 07/26/2022   CREATININE 0.91 07/26/2022   BUN 11 07/26/2022   CO2 25 07/26/2022   TSH 3.570 04/10/2022   HGBA1C 7.3 (H) 07/26/2022     Assessment & Plan:   Problem List Items Addressed This Visit       Endocrine   Type 2 diabetes mellitus without complication, without long-term current use of insulin ([place]) - Primary    Improving but uncontrolled.  Stopping metformin and starting metformin XR 750 mg twice daily.  Follow-up in 3 months.      Relevant Medications   metFORMIN (GLUCOPHAGE-XR) 750 MG 24 hr tablet    Meds ordered this encounter  Medications   metFORMIN (GLUCOPHAGE-XR) 750 MG  24 hr tablet    Sig: Take 1 tablet (750 mg total) by mouth 2 (two) times daily.    Dispense:  180 tablet    Refill:  1    Follow-up: 3 months  [place] DO [place]

## 2022-08-01 NOTE — Assessment & Plan Note (Signed)
Improving but uncontrolled.  Stopping metformin and starting metformin XR 750 mg twice daily.  Follow-up in 3 months.

## 2022-08-01 NOTE — Patient Instructions (Signed)
Medication as prescribed.  Follow up in 3 months.  Take care  Dr. Lacinda Axon

## 2022-08-24 NOTE — Progress Notes (Signed)
Attempted to call patient to schedule follow-up LDCT. Unable to reach the patient at this time, detailed VM left asking that the patient return my call.  

## 2022-08-27 ENCOUNTER — Telehealth: Payer: Self-pay

## 2022-08-27 NOTE — Telephone Encounter (Signed)
Encourage patient to contact the pharmacy for refills or they can request refills through [facility]  (Please schedule appointment if patient has not been seen in over a year)    WHAT PHARMACY WOULD THEY LIKE THIS SENT TO: [place] (CRESTOR) 10 MG tablet   NOTES/COMMENTS FROM PATIENT:This RX has new insurance she only pay less 4.00 on 90 day for levothyroxine and metformin is free but is there a cheaper was for the rosuvastatin if she uses [facility] and not insurance it give it to her for 15.00       [place] office please notify patient: It takes 48-72 hours to process rx refill requests Ask patient to call pharmacy to ensure rx is ready before heading there.

## 2022-08-27 NOTE — Telephone Encounter (Signed)
Pt returned call and verbalized understanding  

## 2022-08-27 NOTE — Telephone Encounter (Signed)
Rosuvastatin required PA-PA approved from 08/26/22-08/17/23. Contacted [facility] to see if there was anything else that needed to be done. PA went through at pharmacy but it is going to cost $20. Left message for pt to return call.

## 2022-09-27 NOTE — Progress Notes (Signed)
Attempted to call patient to schedule follow-up LDCT. Unable to reach the patient at this time, detailed VM left asking that the patient return my call.

## 2022-10-03 ENCOUNTER — Other Ambulatory Visit: Payer: Self-pay

## 2022-10-03 DIAGNOSIS — Z122 Encounter for screening for malignant neoplasm of respiratory organs: Secondary | ICD-10-CM

## 2022-10-03 DIAGNOSIS — Z87891 Personal history of nicotine dependence: Secondary | ICD-10-CM

## 2022-10-03 NOTE — Progress Notes (Signed)
LDCT order placed per protocol. Scan scheduled for 03/27 at 12Noon

## 2022-10-22 ENCOUNTER — Encounter: Payer: Self-pay | Admitting: Family Medicine

## 2022-10-22 DIAGNOSIS — Z79899 Other long term (current) drug therapy: Secondary | ICD-10-CM

## 2022-10-22 DIAGNOSIS — E119 Type 2 diabetes mellitus without complications: Secondary | ICD-10-CM

## 2022-10-22 DIAGNOSIS — E785 Hyperlipidemia, unspecified: Secondary | ICD-10-CM

## 2022-10-22 DIAGNOSIS — E039 Hypothyroidism, unspecified: Secondary | ICD-10-CM

## 2022-10-23 NOTE — Telephone Encounter (Signed)
Cook, Jayce G, DO     CBC, CMP, Lipid, A1C, TSH.  Thank you  Dr. Lacinda Axon

## 2022-10-24 ENCOUNTER — Telehealth: Payer: Self-pay

## 2022-10-24 NOTE — Telephone Encounter (Signed)
Labs need to be ordered has appt on 03/20   Latasha Lindsey [phone]

## 2022-10-30 LAB — CMP14+EGFR
ALT: 30 IU/L (ref 0–32)
AST: 36 IU/L (ref 0–40)
Albumin/Globulin Ratio: 1.9 (ref 1.2–2.2)
Albumin: 4.9 g/dL (ref 3.9–4.9)
Alkaline Phosphatase: 84 IU/L (ref 44–121)
BUN/Creatinine Ratio: 14 (ref 12–28)
BUN: 13 mg/dL (ref 8–27)
Bilirubin Total: 0.5 mg/dL (ref 0.0–1.2)
CO2: 23 mmol/L (ref 20–29)
Calcium: 9.9 mg/dL (ref 8.7–10.3)
Chloride: 102 mmol/L (ref 96–106)
Creatinine, Ser: 0.92 mg/dL (ref 0.57–1.00)
Globulin, Total: 2.6 g/dL (ref 1.5–4.5)
Glucose: 150 mg/dL — ABNORMAL HIGH (ref 70–99)
Potassium: 5 mmol/L (ref 3.5–5.2)
Sodium: 142 mmol/L (ref 134–144)
Total Protein: 7.5 g/dL (ref 6.0–8.5)
eGFR: 70 mL/min/{1.73_m2} (ref >59)

## 2022-10-30 LAB — LIPID PANEL
Chol/HDL Ratio: 3.9 ratio (ref 0.0–4.4)
Cholesterol, Total: 157 mg/dL (ref 100–199)
HDL: 40 mg/dL (ref >39)
LDL Chol Calc (NIH): 82 mg/dL (ref 0–99)
Triglycerides: 208 mg/dL — ABNORMAL HIGH (ref 0–149)
VLDL Cholesterol Cal: 35 mg/dL (ref 5–40)

## 2022-10-30 LAB — CBC WITH DIFFERENTIAL/PLATELET
Basophils Absolute: 0.1 10*3/uL (ref 0.0–0.2)
Basos: 1 %
EOS (ABSOLUTE): 0.3 10*3/uL (ref 0.0–0.4)
Eos: 3 %
Hematocrit: 46.2 % (ref 34.0–46.6)
Hemoglobin: 15.5 g/dL (ref 11.1–15.9)
Immature Grans (Abs): 0 10*3/uL (ref 0.0–0.1)
Immature Granulocytes: 0 %
Lymphocytes Absolute: 2.5 10*3/uL (ref 0.7–3.1)
Lymphs: 27 %
MCH: 31.3 pg (ref 26.6–33.0)
MCHC: 33.5 g/dL (ref 31.5–35.7)
MCV: 93 fL (ref 79–97)
Monocytes Absolute: 0.7 10*3/uL (ref 0.1–0.9)
Monocytes: 8 %
Neutrophils Absolute: 5.5 10*3/uL (ref 1.4–7.0)
Neutrophils: 61 %
Platelets: 266 10*3/uL (ref 150–450)
RBC: 4.95 x10E6/uL (ref 3.77–5.28)
RDW: 12.5 % (ref 11.7–15.4)
WBC: 9.1 10*3/uL (ref 3.4–10.8)

## 2022-10-30 LAB — TSH: TSH: 3.62 u[IU]/mL (ref 0.450–4.500)

## 2022-10-30 LAB — HEMOGLOBIN A1C
Est. average glucose Bld gHb Est-mCnc: 151 mg/dL
Hgb A1c MFr Bld: 6.9 % — ABNORMAL HIGH (ref 4.8–5.6)

## 2022-10-31 ENCOUNTER — Encounter: Payer: Self-pay | Admitting: Family Medicine

## 2022-10-31 ENCOUNTER — Ambulatory Visit (INDEPENDENT_AMBULATORY_CARE_PROVIDER_SITE_OTHER): Payer: 59 | Admitting: Family Medicine

## 2022-10-31 DIAGNOSIS — E119 Type 2 diabetes mellitus without complications: Secondary | ICD-10-CM

## 2022-10-31 DIAGNOSIS — M7918 Myalgia, other site: Secondary | ICD-10-CM | POA: Diagnosis not present

## 2022-10-31 DIAGNOSIS — E785 Hyperlipidemia, unspecified: Secondary | ICD-10-CM | POA: Diagnosis not present

## 2022-10-31 MED ORDER — TIZANIDINE HCL 4 MG PO TABS
4.0000 mg | ORAL_TABLET | Freq: Three times a day (TID) | ORAL | 0 refills | Status: DC | PRN
Start: 2022-10-31 — End: 2023-05-03

## 2022-10-31 NOTE — Patient Instructions (Signed)
Medication as directed.  Follow up in 6 months.  Take care  Dr. Lacinda Axon

## 2022-10-31 NOTE — Progress Notes (Signed)
Subjective:  Patient ID: Latasha Lindsey, female    DOB: 08-21-1955  Age: 67 y.o. MRN: [id number]  CC: Chief Complaint  Patient presents with   Diabetes    Blood work follow up    HPI:  67 year old female with the below mentioned medical problems presents for follow-up.  Patient's type 2 diabetes is well-controlled.  Most recent A1c 6.9.  She has done much better on metformin XR.  GI symptoms have improved since switch to XR formulation of metformin.  BP mildly elevated here today.  Most recent lipid panel revealed fairly good control with the LDL of 82.  Would like her LDL 70 or less.  Patient working on her diet and trying to stay active.  She is compliant with Crestor.  Additionally, patient reports that she has had right-sided upper back pain for the past several months.  Started after she was using a Doctor, general practice at the gym.  She states that it continues to bother her on a regular basis.  She has taken some over-the-counter medication without relief.  Patient Active Problem List   Diagnosis Date Noted   Musculoskeletal pain 10/31/2022   Emphysema lung ([place]) 05/02/2022   Hepatic steatosis 08/31/2021   Type 2 diabetes mellitus without complication, without long-term current use of insulin ([place]) 05/08/2019   Hypothyroidism 01/11/2015   Hyperlipidemia     Social Hx   Social History   Socioeconomic History   Marital status: Divorced    Spouse name: Not on file   Number of children: Not on file   Years of education: Not on file   Highest education level: Not on file  Occupational History   Not on file  Tobacco Use   Smoking status: Former    Packs/day: 1.00    Years: 30.00    Additional pack years: 0.00    Total pack years: 30.00    Types: Cigarettes    Quit date: 10/13/2009    Years since quitting: 13.0   Smokeless tobacco: Never  Vaping Use   Vaping Use: Never used  Substance and Sexual Activity   Alcohol use: Yes    Alcohol/week: 1.0 standard drink of  alcohol    Types: 1 Standard drinks or equivalent per week   Drug use: No   Sexual activity: Yes    Birth control/protection: Surgical  Other Topics Concern   Not on file  Social History Narrative   Not on file   Social Determinants of Health   Financial Resource Strain: Not on file  Food Insecurity: Not on file  Transportation Needs: Not on file  Physical Activity: Not on file  Stress: Not on file  Social Connections: Not on file    Review of Systems Per HPI  Objective:  BP (!) 143/84   Pulse 72   Temp 98.6 F (37 C)   Ht 5\' 3"  (1.6 m)   Wt 203 lb (92.1 kg)   SpO2 97%   BMI 35.96 kg/m      10/31/2022   11:10 AM 10/31/2022   10:24 AM 08/01/2022    9:55 AM  BP/Weight  Systolic BP [id number]  Diastolic BP 84 82 76  Wt. (Lbs)  203 205.4  BMI  35.96 kg/m2 36.38 kg/m2    Physical Exam Vitals and nursing note reviewed.  Constitutional:      General: She is not in acute distress.    Appearance: Normal appearance.  HENT:     Head: Normocephalic and atraumatic.  Eyes:  General:        Right eye: No discharge.        Left eye: No discharge.     Conjunctiva/sclera: Conjunctivae normal.  Cardiovascular:     Rate and Rhythm: Normal rate and regular rhythm.  Pulmonary:     Effort: Pulmonary effort is normal.     Breath sounds: Normal breath sounds. No wheezing, rhonchi or rales.  Musculoskeletal:       Arms:     Comments: Mild tenderness at the labeled location.  Neurological:     Mental Status: She is alert.  Psychiatric:        Mood and Affect: Mood normal.        Behavior: Behavior normal.     Lab Results  Component Value Date   WBC 9.1 10/29/2022   HGB 15.5 10/29/2022   HCT 46.2 10/29/2022   PLT 266 10/29/2022   GLUCOSE 150 (H) 10/29/2022   CHOL 157 10/29/2022   TRIG 208 (H) 10/29/2022   HDL 40 10/29/2022   LDLCALC 82 10/29/2022   ALT 30 10/29/2022   AST 36 10/29/2022   NA 142 10/29/2022   K 5.0 10/29/2022   CL 102 10/29/2022    CREATININE 0.92 10/29/2022   BUN 13 10/29/2022   CO2 23 10/29/2022   TSH 3.620 10/29/2022   HGBA1C 6.9 (H) 10/29/2022     Assessment & Plan:   Problem List Items Addressed This Visit       Endocrine   Type 2 diabetes mellitus without complication, without long-term current use of insulin (HCC)    A1c at goal.  Continue metformin XR.        Other   Hyperlipidemia    Fair control.  Would like LDL below 70.  Patient working on diet.  Continue Crestor.      Musculoskeletal pain    Appears to be muscular in nature.  Advised heat.  Zanaflex as directed.       Meds ordered this encounter  Medications   tiZANidine (ZANAFLEX) 4 MG tablet    Sig: Take 1 tablet (4 mg total) by mouth every 8 (eight) hours as needed for muscle spasms.    Dispense:  30 tablet    Refill:  0    Follow-up:  Return in about 6 months (around 05/03/2023).  [place]

## 2022-10-31 NOTE — Assessment & Plan Note (Signed)
A1c at goal.  Continue metformin XR.

## 2022-10-31 NOTE — Assessment & Plan Note (Signed)
Fair control.  Would like LDL below 70.  Patient working on diet.  Continue Crestor.

## 2022-10-31 NOTE — Assessment & Plan Note (Signed)
Appears to be muscular in nature.  Advised heat.  Zanaflex as directed.

## 2022-11-07 ENCOUNTER — Ambulatory Visit (HOSPITAL_COMMUNITY)
Admission: RE | Admit: 2022-11-07 | Discharge: 2022-11-07 | Disposition: A | Discharge status: Home / Self Care | Payer: 59 | Source: Ambulatory Visit | Attending: Physician Assistant | Admitting: Physician Assistant

## 2022-11-07 DIAGNOSIS — Z122 Encounter for screening for malignant neoplasm of respiratory organs: Secondary | ICD-10-CM | POA: Diagnosis present

## 2022-11-07 DIAGNOSIS — Z87891 Personal history of nicotine dependence: Secondary | ICD-10-CM | POA: Diagnosis not present

## 2022-11-16 NOTE — Progress Notes (Signed)
Patient notified of LDCT Lung Cancer Screening Results via mail with the recommendation to follow-up in 12 months. Patient's referring provider has been sent a copy of results. Results are as follows:  IMPRESSION: 1. Lung-RADS 2, benign appearance or behavior. Continue annual screening with low-dose chest CT without contrast in 12 months. 2. Coronary artery calcifications, aortic Atherosclerosis (ICD10-I70.0) and Emphysema (ICD10-J43.9).

## 2023-01-24 ENCOUNTER — Other Ambulatory Visit: Payer: Self-pay | Admitting: Family Medicine

## 2023-03-23 ENCOUNTER — Other Ambulatory Visit: Payer: Self-pay | Admitting: Family Medicine

## 2023-03-28 ENCOUNTER — Other Ambulatory Visit: Payer: Self-pay | Admitting: Family Medicine

## 2023-03-28 DIAGNOSIS — E785 Hyperlipidemia, unspecified: Secondary | ICD-10-CM

## 2023-04-22 ENCOUNTER — Other Ambulatory Visit (HOSPITAL_COMMUNITY): Payer: Self-pay | Admitting: Family Medicine

## 2023-04-22 ENCOUNTER — Telehealth: Payer: Self-pay | Admitting: Family Medicine

## 2023-04-22 DIAGNOSIS — Z1231 Encounter for screening mammogram for malignant neoplasm of breast: Secondary | ICD-10-CM

## 2023-04-22 NOTE — Telephone Encounter (Signed)
Patient has 6 month follow up on 9/20 and wanting to know if she needed labs if so she wanted her urine test added . She states seen it on mychart that it was over due and would like to get that down.

## 2023-04-23 ENCOUNTER — Other Ambulatory Visit: Payer: Self-pay

## 2023-04-23 DIAGNOSIS — E785 Hyperlipidemia, unspecified: Secondary | ICD-10-CM

## 2023-04-23 DIAGNOSIS — Z130 Encounter for screening for diseases of the blood and blood-forming organs and certain disorders involving the immune mechanism: Secondary | ICD-10-CM

## 2023-04-23 DIAGNOSIS — E119 Type 2 diabetes mellitus without complications: Secondary | ICD-10-CM

## 2023-04-23 DIAGNOSIS — Z79899 Other long term (current) drug therapy: Secondary | ICD-10-CM

## 2023-04-23 NOTE — Telephone Encounter (Signed)
Patient informed of her lab orders per drs notes

## 2023-04-28 IMAGING — CT CT CHEST LUNG CANCER SCREENING LOW DOSE W/O CM
1 series · 10 of 10 positions shown, 13 images · non-contrast
Comparison: 06/16/2020.

CLINICAL DATA: Former smoker, 45 pack-year history.



[ct lung segmentation data · axial · 0.68mm/px · z∈[-222,-222]mm · 10 of 284 frames shown]
[frame 1/284  mediastinal]
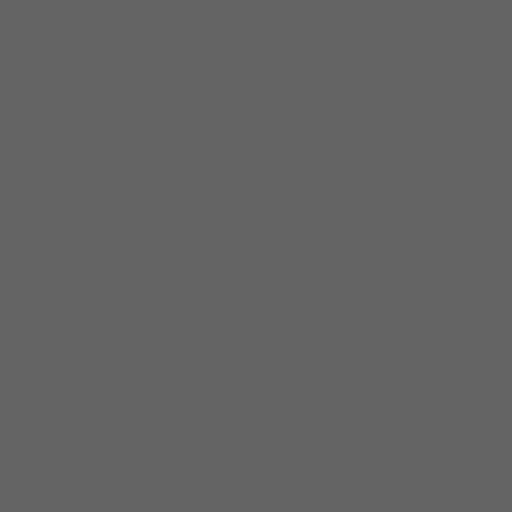
[frame 1/284  lung]
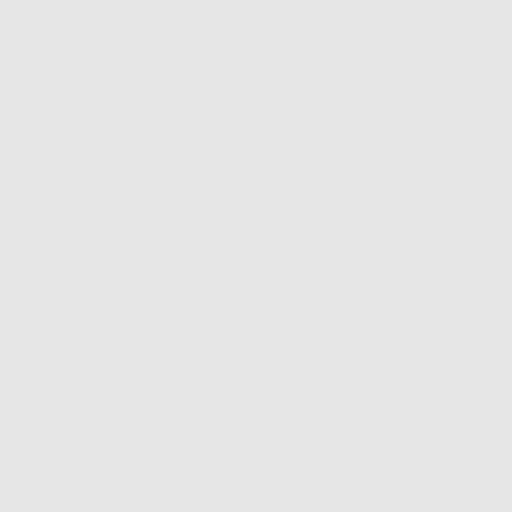
[frame 32/284  lung]
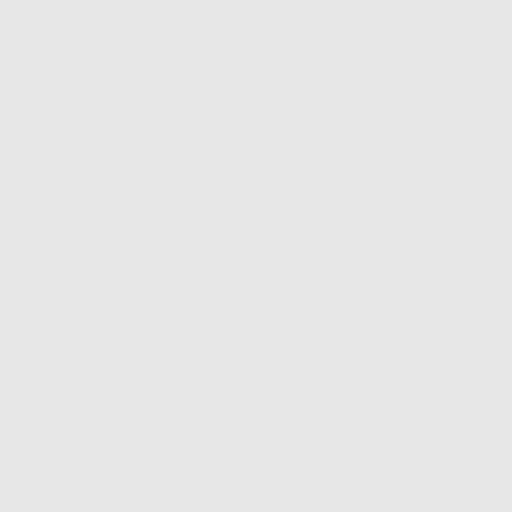
[frame 63/284  lung]
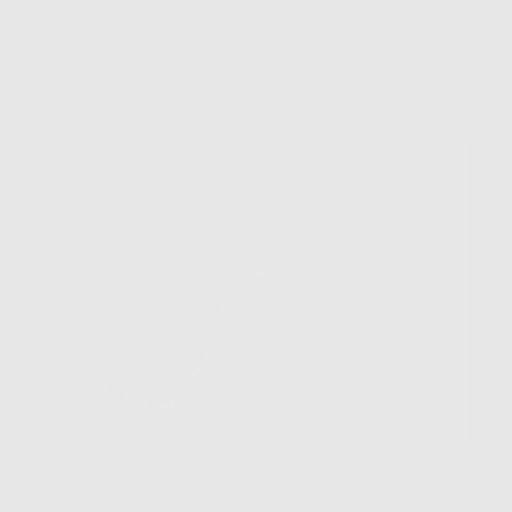
[frame 95/284  lung]
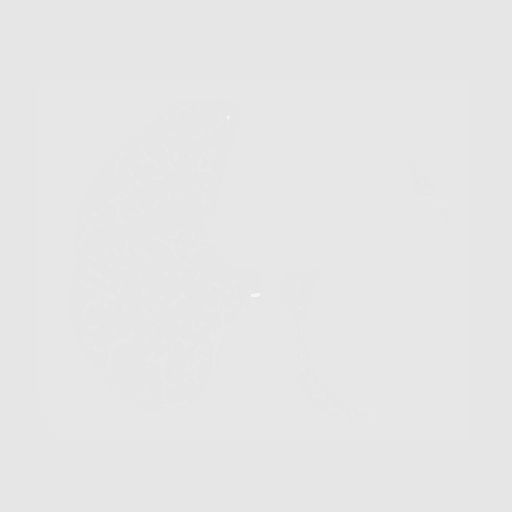
[frame 126/284  mediastinal]
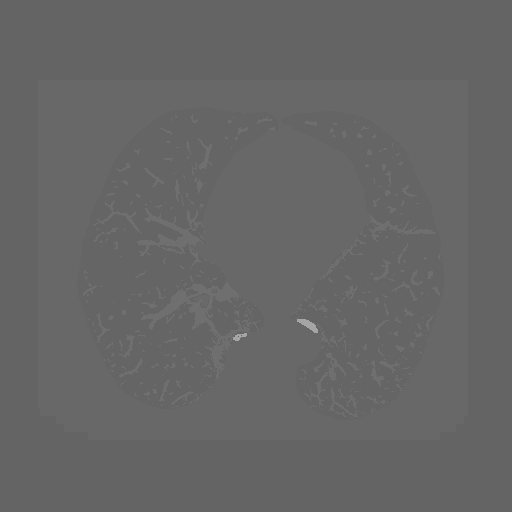
[frame 126/284  lung]
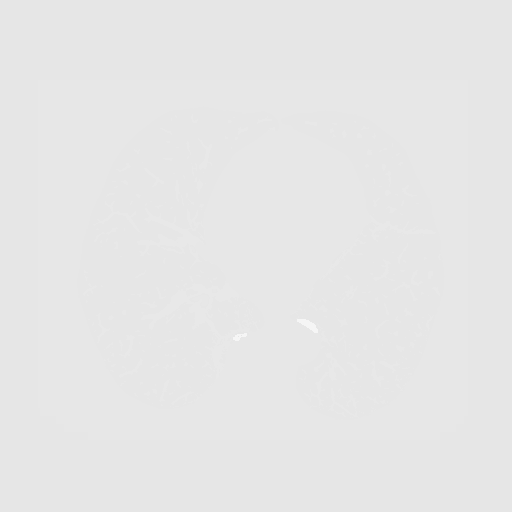
[frame 158/284  lung]
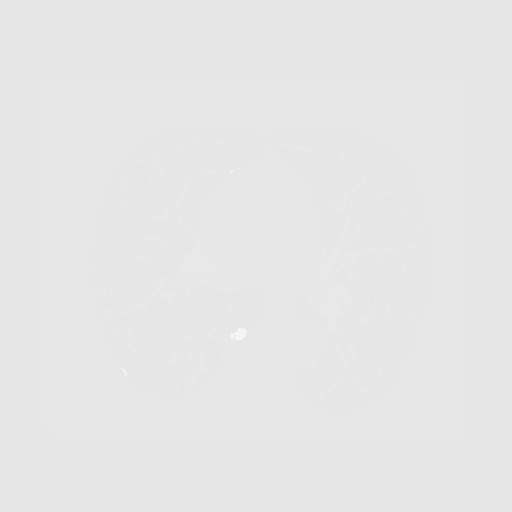
[frame 189/284  lung]
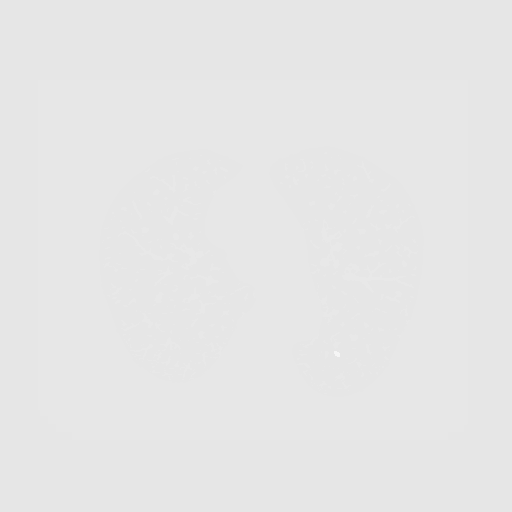
[frame 221/284  lung]
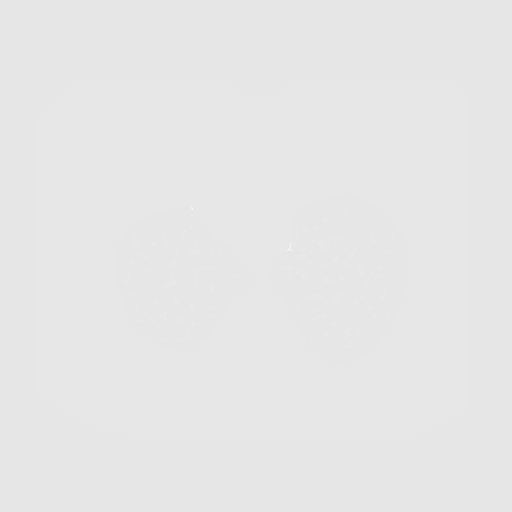
[frame 252/284  mediastinal]
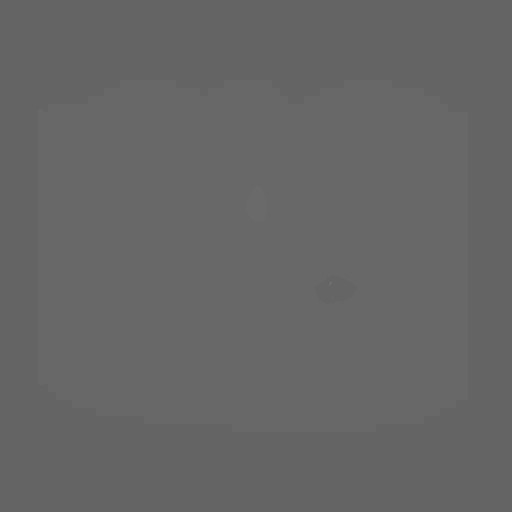
[frame 252/284  lung]
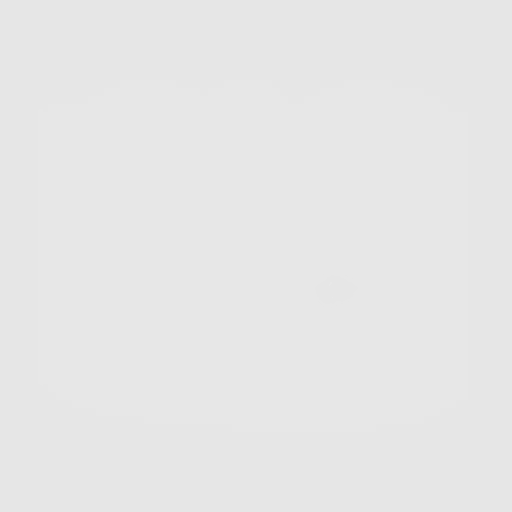
[frame 284/284  lung]
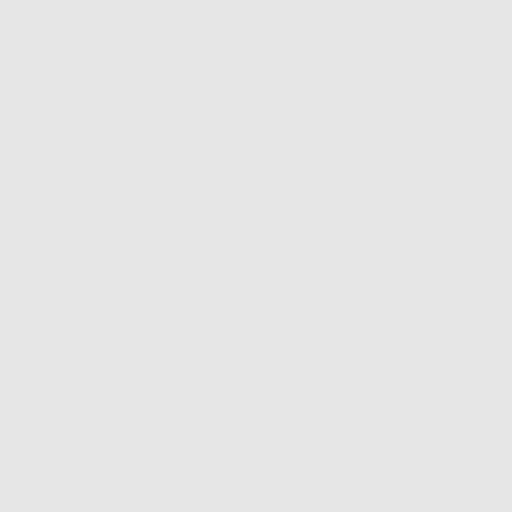

[10 of 10 positions shown; findings below may reference images not displayed]

FINDINGS: Cardiovascular: Atherosclerotic calcification of the aorta and
coronary arteries. Heart size normal. No pericardial effusion.

Mediastinum/Nodes: No pathologically enlarged mediastinal or
axillary lymph nodes. Hilar regions are difficult to definitively
evaluate without IV contrast. Esophagus is grossly unremarkable.

Lungs/Pleura: Mild centrilobular emphysema. 5.9 mm subpleural left
lower lobe nodule, unchanged. No suspicious pulmonary nodules.
Scattered pulmonary parenchymal scarring. No pleural fluid. Airway
is unremarkable.

Upper Abdomen: Liver is decreased in attenuation diffusely.
Visualized portions of the liver, adrenal glands, left kidney,
spleen, pancreas, stomach and bowel are otherwise grossly
unremarkable.

Musculoskeletal: Degenerative changes in the spine. No worrisome
lytic or sclerotic lesions.
IMPRESSION: 1. Lung-RADS 2, benign appearance or behavior. Continue annual
screening with low-dose chest CT without contrast in 12 months.
2. Hepatic steatosis.
3. Aortic atherosclerosis ([id number]-[id number].N). Coronary artery
calcification.
4.  Emphysema ([id number].M).

## 2023-05-02 LAB — HM DIABETES EYE EXAM

## 2023-05-03 ENCOUNTER — Ambulatory Visit: Payer: 59 | Admitting: Family Medicine

## 2023-05-03 VITALS — BP 132/80 | HR 71 | Ht 63.0 in | Wt 204.4 lb

## 2023-05-03 DIAGNOSIS — Z7984 Long term (current) use of oral hypoglycemic drugs: Secondary | ICD-10-CM

## 2023-05-03 DIAGNOSIS — Z23 Encounter for immunization: Secondary | ICD-10-CM | POA: Diagnosis not present

## 2023-05-03 DIAGNOSIS — E039 Hypothyroidism, unspecified: Secondary | ICD-10-CM

## 2023-05-03 DIAGNOSIS — E785 Hyperlipidemia, unspecified: Secondary | ICD-10-CM

## 2023-05-03 DIAGNOSIS — E119 Type 2 diabetes mellitus without complications: Secondary | ICD-10-CM

## 2023-05-03 DIAGNOSIS — M546 Pain in thoracic spine: Secondary | ICD-10-CM

## 2023-05-03 MED ORDER — ROSUVASTATIN CALCIUM 10 MG PO TABS: 10.0000 mg | ORAL_TABLET | Freq: Every day | ORAL | 0 refills | Status: DC

## 2023-05-03 MED ORDER — MELOXICAM 15 MG PO TABS: 15.0000 mg | ORAL_TABLET | Freq: Every day | ORAL | 1 refills | Status: DC | PRN

## 2023-05-03 MED ORDER — METFORMIN HCL ER 750 MG PO TB24: 750.0000 mg | ORAL_TABLET | Freq: Two times a day (BID) | ORAL | 0 refills | Status: DC

## 2023-05-03 MED ORDER — LEVOTHYROXINE SODIUM 100 MCG PO TABS
100.0000 ug | ORAL_TABLET | Freq: Every day | ORAL | 0 refills | Status: DC
Start: 2023-05-03 — End: 2023-07-22

## 2023-05-03 NOTE — Patient Instructions (Signed)
Medication as needed.  Follow up in 3 months.   A1c before next visit.

## 2023-05-05 DIAGNOSIS — M549 Dorsalgia, unspecified: Secondary | ICD-10-CM | POA: Insufficient documentation

## 2023-05-05 NOTE — Progress Notes (Signed)
Subjective:  Patient ID: Latasha Lindsey, female    DOB: Jun 14, 1956  Age: 67 y.o. MRN: [id number]  CC: Follow up  HPI:  67 year old female with type 2 diabetes, hypothyroidism, hyperlipidemia, COPD presents for follow-up.  Patient recently had her labs done.  LFTs were elevated.  Mildly elevated creatinine.  Advised that she needs to increase her water intake.  Patient's A1c uncontrolled at 7.6.  Patient reports recent dietary indiscretion.  LDL was 80.  Patient on Crestor and tolerating.  Patient reports that she has had some pain in her right upper back recently.  No recent fall, trauma, injury.  Patient Active Problem List   Diagnosis Date Noted   Back pain 05/05/2023   Emphysema lung ([facility]) 05/02/2022   Hepatic steatosis 08/31/2021   Type 2 diabetes mellitus without complication, without long-term current use of insulin (HCC) 05/08/2019   Hypothyroidism 01/11/2015   Hyperlipidemia     Social Hx   Social History   Socioeconomic History   Marital status: Divorced    Spouse name: Not on file   Number of children: Not on file   Years of education: Not on file   Highest education level: GED or equivalent  Occupational History   Not on file  Tobacco Use   Smoking status: Former    Current packs/day: 0.00    Average packs/day: 1 pack/day for 30.0 years (30.0 ttl pk-yrs)    Types: Cigarettes    Start date: 10/14/1979    Quit date: 10/13/2009    Years since quitting: 13.5   Smokeless tobacco: Never  Vaping Use   Vaping status: Never Used  Substance and Sexual Activity   Alcohol use: Yes    Alcohol/week: 1.0 standard drink of alcohol    Types: 1 Standard drinks or equivalent per week   Drug use: No   Sexual activity: Yes    Birth control/protection: Surgical  Other Topics Concern   Not on file  Social History Narrative   Not on file   Social Determinants of Health   Financial Resource Strain: Low Risk  (04/29/2023)   Overall Financial Resource Strain (CARDIA)     Difficulty of Paying Living Expenses: Not hard at all  Food Insecurity: No Food Insecurity (04/29/2023)   Hunger Vital Sign    Worried About Running Out of Food in the Last Year: Never true    Ran Out of Food in the Last Year: Never true  Transportation Needs: No Transportation Needs (04/29/2023)   PRAPARE - Administrator, Civil Service (Medical): No    Lack of Transportation (Non-Medical): No  Physical Activity: Insufficiently Active (04/29/2023)   Exercise Vital Sign    Days of Exercise per Week: 1 day    Minutes of Exercise per Session: 20 min  Stress: No Stress Concern Present (04/29/2023)   [employer] of Occupational Health - Occupational Stress Questionnaire    Feeling of Stress : Not at all  Social Connections: Moderately Isolated (04/29/2023)   Social Connection and Isolation Panel [NHANES]    Frequency of Communication with Friends and Family: More than three times a week    Frequency of Social Gatherings with Friends and Family: More than three times a week    Attends Religious Services: Never    Database administrator or Organizations: No    Attends Engineer, structural: Not on file    Marital Status: Living with partner    Review of Systems Per HPI  Objective:  BP  132/80   Pulse 71   Ht 5\' 3"  (1.6 m)   Wt 204 lb 6.4 oz (92.7 kg)   SpO2 94%   BMI 36.21 kg/m      05/03/2023   11:31 AM 05/03/2023   10:52 AM 10/31/2022   11:10 AM  BP/Weight  Systolic BP 132 144 143  Diastolic BP 80 92 84  Wt. (Lbs)  204.4   BMI  36.21 kg/m2     Physical Exam Constitutional:      Appearance: Normal appearance. She is obese.  HENT:     Head: Normocephalic and atraumatic.  Eyes:     General:        Right eye: No discharge.        Left eye: No discharge.     Conjunctiva/sclera: Conjunctivae normal.  Cardiovascular:     Rate and Rhythm: Normal rate and regular rhythm.  Pulmonary:     Effort: Pulmonary effort is normal.     Breath sounds: Normal  breath sounds. No wheezing, rhonchi or rales.  Neurological:     Mental Status: She is alert.  Psychiatric:        Mood and Affect: Mood normal.        Behavior: Behavior normal.     Lab Results  Component Value Date   WBC 7.8 04/30/2023   HGB 15.0 04/30/2023   HCT 47.3 (H) 04/30/2023   PLT 250 04/30/2023   GLUCOSE 167 (H) 04/30/2023   CHOL 155 04/30/2023   TRIG 215 (H) 04/30/2023   HDL 39 (L) 04/30/2023   LDLCALC 80 04/30/2023   ALT 48 (H) 04/30/2023   AST 68 (H) 04/30/2023   NA 142 04/30/2023   K 4.6 04/30/2023   CL 101 04/30/2023   CREATININE 1.06 (H) 04/30/2023   BUN 10 04/30/2023   CO2 23 04/30/2023   TSH 3.620 10/29/2022   HGBA1C 7.6 (H) 04/30/2023     Assessment & Plan:   Problem List Items Addressed This Visit       Endocrine   Type 2 diabetes mellitus without complication, without long-term current use of insulin (HCC) - Primary    Recent worsening.  A1c 7.6.  Likely due to dietary indiscretion.  Patient wants to try lifestyle change as opposed to additional medication.  Continue metformin.  Follow-up in 3 months.      Relevant Medications   metFORMIN (GLUCOPHAGE-XR) 750 MG 24 hr tablet   rosuvastatin (CRESTOR) 10 MG tablet   Hypothyroidism    Has been stable.  Levothyroxine refilled.      Relevant Medications   levothyroxine (SYNTHROID) 100 MCG tablet     Other   Hyperlipidemia    Continue statin.  Dietary changes/lifestyle changes.      Relevant Medications   rosuvastatin (CRESTOR) 10 MG tablet   Back pain    Meloxicam as directed.      Relevant Medications   meloxicam (MOBIC) 15 MG tablet   Other Visit Diagnoses     Needs flu shot       Relevant Orders   Flu vaccine trivalent PF, 6mos and older(Flulaval,Afluria,Fluarix,Fluzone) (Completed)       Meds ordered this encounter  Medications   levothyroxine (SYNTHROID) 100 MCG tablet    Sig: Take 1 tablet (100 mcg total) by mouth daily.    Dispense:  60 tablet    Refill:  0    metFORMIN (GLUCOPHAGE-XR) 750 MG 24 hr tablet    Sig: Take 1 tablet (750 mg total) by  mouth 2 (two) times daily.    Dispense:  180 tablet    Refill:  0   rosuvastatin (CRESTOR) 10 MG tablet    Sig: Take 1 tablet (10 mg total) by mouth daily. for cholesterol.    Dispense:  90 tablet    Refill:  0   meloxicam (MOBIC) 15 MG tablet    Sig: Take 1 tablet (15 mg total) by mouth daily as needed for pain.    Dispense:  30 tablet    Refill:  1    Follow-up:  Return in about 3 months (around 08/02/2023) for Diabetes follow up.  Everlene Other DO [facility]

## 2023-05-05 NOTE — Assessment & Plan Note (Signed)
Meloxicam as directed

## 2023-05-05 NOTE — Assessment & Plan Note (Signed)
Continue statin.  Dietary changes/lifestyle changes.

## 2023-05-05 NOTE — Assessment & Plan Note (Signed)
Recent worsening.  A1c 7.6.  Likely due to dietary indiscretion.  Patient wants to try lifestyle change as opposed to additional medication.  Continue metformin.  Follow-up in 3 months.

## 2023-05-05 NOTE — Assessment & Plan Note (Signed)
Has been stable.  Levothyroxine refilled.

## 2023-05-13 ENCOUNTER — Encounter: Payer: Self-pay | Admitting: *Deleted

## 2023-06-07 ENCOUNTER — Encounter (HOSPITAL_COMMUNITY): Payer: Self-pay

## 2023-06-07 ENCOUNTER — Ambulatory Visit (HOSPITAL_COMMUNITY)
Admission: RE | Admit: 2023-06-07 | Discharge: 2023-06-07 | Disposition: A | Discharge status: Home / Self Care | Payer: 59 | Source: Ambulatory Visit | Attending: Family Medicine | Admitting: Family Medicine

## 2023-06-07 DIAGNOSIS — Z1231 Encounter for screening mammogram for malignant neoplasm of breast: Secondary | ICD-10-CM | POA: Diagnosis present

## 2023-07-15 DIAGNOSIS — Z008 Encounter for other general examination: Secondary | ICD-10-CM | POA: Diagnosis not present

## 2023-07-15 DIAGNOSIS — E039 Hypothyroidism, unspecified: Secondary | ICD-10-CM | POA: Diagnosis not present

## 2023-07-15 DIAGNOSIS — E1122 Type 2 diabetes mellitus with diabetic chronic kidney disease: Secondary | ICD-10-CM | POA: Diagnosis not present

## 2023-07-15 DIAGNOSIS — Z6834 Body mass index (BMI) 34.0-34.9, adult: Secondary | ICD-10-CM | POA: Diagnosis not present

## 2023-07-15 DIAGNOSIS — N1831 Chronic kidney disease, stage 3a: Secondary | ICD-10-CM | POA: Diagnosis not present

## 2023-07-15 DIAGNOSIS — E785 Hyperlipidemia, unspecified: Secondary | ICD-10-CM | POA: Diagnosis not present

## 2023-07-15 DIAGNOSIS — I700 Atherosclerosis of aorta: Secondary | ICD-10-CM | POA: Diagnosis not present

## 2023-07-15 DIAGNOSIS — E1169 Type 2 diabetes mellitus with other specified complication: Secondary | ICD-10-CM | POA: Diagnosis not present

## 2023-07-15 DIAGNOSIS — Z860100 Personal history of colon polyps, unspecified: Secondary | ICD-10-CM | POA: Diagnosis not present

## 2023-07-15 DIAGNOSIS — F17211 Nicotine dependence, cigarettes, in remission: Secondary | ICD-10-CM | POA: Diagnosis not present

## 2023-07-15 DIAGNOSIS — E669 Obesity, unspecified: Secondary | ICD-10-CM | POA: Diagnosis not present

## 2023-07-22 ENCOUNTER — Other Ambulatory Visit: Payer: Self-pay | Admitting: Family Medicine

## 2023-07-23 ENCOUNTER — Telehealth: Payer: Self-pay

## 2023-07-23 DIAGNOSIS — E119 Type 2 diabetes mellitus without complications: Secondary | ICD-10-CM

## 2023-07-23 NOTE — Telephone Encounter (Signed)
Pt is needing blood order for A1C follow up appt 08/02/2023   Call [facility] at [phone] when blood work is ordered

## 2023-07-25 NOTE — Telephone Encounter (Signed)
Blood Work ordered in EPIC. Patient notified 

## 2023-07-25 NOTE — Telephone Encounter (Signed)
Everlene Other G, DO     Please order A1c.

## 2023-07-26 DIAGNOSIS — E119 Type 2 diabetes mellitus without complications: Secondary | ICD-10-CM | POA: Diagnosis not present

## 2023-07-27 LAB — HEMOGLOBIN A1C
Est. average glucose Bld gHb Est-mCnc: 163 mg/dL
Hgb A1c MFr Bld: 7.3 % — ABNORMAL HIGH (ref 4.8–5.6)

## 2023-08-02 ENCOUNTER — Ambulatory Visit (INDEPENDENT_AMBULATORY_CARE_PROVIDER_SITE_OTHER): Payer: No Typology Code available for payment source | Admitting: Family Medicine

## 2023-08-02 VITALS — BP 130/75 | HR 65 | Temp 97.2°F | Ht 63.0 in | Wt 204.0 lb

## 2023-08-02 DIAGNOSIS — E119 Type 2 diabetes mellitus without complications: Secondary | ICD-10-CM

## 2023-08-02 DIAGNOSIS — M546 Pain in thoracic spine: Secondary | ICD-10-CM

## 2023-08-02 DIAGNOSIS — Z7984 Long term (current) use of oral hypoglycemic drugs: Secondary | ICD-10-CM

## 2023-08-02 DIAGNOSIS — N898 Other specified noninflammatory disorders of vagina: Secondary | ICD-10-CM | POA: Diagnosis not present

## 2023-08-02 DIAGNOSIS — E1169 Type 2 diabetes mellitus with other specified complication: Secondary | ICD-10-CM | POA: Diagnosis not present

## 2023-08-02 DIAGNOSIS — E785 Hyperlipidemia, unspecified: Secondary | ICD-10-CM | POA: Diagnosis not present

## 2023-08-02 MED ORDER — FLUCONAZOLE 150 MG PO TABS: 150.0000 mg | ORAL_TABLET | Freq: Once | ORAL | 0 refills | Status: AC

## 2023-08-02 MED ORDER — BACLOFEN 10 MG PO TABS
5.0000 mg | ORAL_TABLET | Freq: Three times a day (TID) | ORAL | 0 refills | Status: DC | PRN
Start: 2023-08-02 — End: 2023-10-31

## 2023-08-02 NOTE — Patient Instructions (Signed)
Follow up in 3 months.  Merry Christmas.  Dr. Adriana Simas

## 2023-08-02 NOTE — Progress Notes (Unsigned)
Subjective:  Patient ID: Latasha Lindsey, female    DOB: 1955-11-14  Age: 67 y.o. MRN: [id number]  CC:   Chief Complaint  Patient presents with   Diabetes    HPI:  67 year old female presents for follow up.  Diabetic control has improved from 7.6 to 7.3. Still not at goal. Currently on Metformin only. She has been doing lifestyle changes in addition to Metformin.  She reports recent vaginal itching.  Additionally, patient reports intermittent right sided thoracic back pain. Worse with certain activities/movements.  No other complaints or concerns at this time.   Patient Active Problem List   Diagnosis Date Noted   Vaginal itching 08/04/2023   Back pain 05/05/2023   Emphysema lung (HCC) 05/02/2022   Hepatic steatosis 08/31/2021   Type 2 diabetes mellitus without complication, without long-term current use of insulin (HCC) 05/08/2019   Hypothyroidism 01/11/2015   Hyperlipidemia     Social Hx   Social History   Socioeconomic History   Marital status: Divorced    Spouse name: Not on file   Number of children: Not on file   Years of education: Not on file   Highest education level: GED or equivalent  Occupational History   Not on file  Tobacco Use   Smoking status: Former    Current packs/day: 0.00    Average packs/day: 1 pack/day for 30.0 years (30.0 ttl pk-yrs)    Types: Cigarettes    Start date: 10/14/1979    Quit date: 10/13/2009    Years since quitting: 13.8   Smokeless tobacco: Never  Vaping Use   Vaping status: Never Used  Substance and Sexual Activity   Alcohol use: Yes    Alcohol/week: 1.0 standard drink of alcohol    Types: 1 Standard drinks or equivalent per week   Drug use: No   Sexual activity: Yes    Birth control/protection: Surgical  Other Topics Concern   Not on file  Social History Narrative   Not on file   Social Drivers of Health   Financial Resource Strain: Low Risk  (04/29/2023)   Overall Financial Resource Strain (CARDIA)     Difficulty of Paying Living Expenses: Not hard at all  Food Insecurity: No Food Insecurity (04/29/2023)   Hunger Vital Sign    Worried About Running Out of Food in the Last Year: Never true    Ran Out of Food in the Last Year: Never true  Transportation Needs: No Transportation Needs (04/29/2023)   PRAPARE - Administrator, Civil Service (Medical): No    Lack of Transportation (Non-Medical): No  Physical Activity: Insufficiently Active (04/29/2023)   Exercise Vital Sign    Days of Exercise per Week: 1 day    Minutes of Exercise per Session: 20 min  Stress: No Stress Concern Present (04/29/2023)   [employer] of Occupational Health - Occupational Stress Questionnaire    Feeling of Stress : Not at all  Social Connections: Moderately Isolated (04/29/2023)   Social Connection and Isolation Panel [NHANES]    Frequency of Communication with Friends and Family: More than three times a week    Frequency of Social Gatherings with Friends and Family: More than three times a week    Attends Religious Services: Never    Database administrator or Organizations: No    Attends Engineer, structural: Not on file    Marital Status: Living with partner    Review of Systems Per HPI  Objective:  BP 130/75   Pulse 65   Temp (!) 97.2 F (36.2 C)   Ht 5\' 3"  (1.6 m)   Wt 204 lb (92.5 kg)   SpO2 97%   BMI 36.14 kg/m      08/02/2023   11:30 AM 08/02/2023   10:30 AM 05/03/2023   11:31 AM  BP/Weight  Systolic BP 130 146 132  Diastolic BP 75 81 80  Wt. (Lbs)  204   BMI  36.14 kg/m2     Physical Exam Vitals and nursing note reviewed.  Constitutional:      General: She is not in acute distress.    Appearance: Normal appearance.  HENT:     Head: Normocephalic and atraumatic.  Eyes:     General:        Right eye: No discharge.        Left eye: No discharge.     Conjunctiva/sclera: Conjunctivae normal.  Cardiovascular:     Rate and Rhythm: Normal rate and regular  rhythm.  Pulmonary:     Effort: Pulmonary effort is normal.     Breath sounds: Normal breath sounds. No wheezing, rhonchi or rales.  Neurological:     Mental Status: She is alert.  Psychiatric:        Mood and Affect: Mood normal.        Behavior: Behavior normal.     Lab Results  Component Value Date   WBC 7.8 04/30/2023   HGB 15.0 04/30/2023   HCT 47.3 (H) 04/30/2023   PLT 250 04/30/2023   GLUCOSE 167 (H) 04/30/2023   CHOL 155 04/30/2023   TRIG 215 (H) 04/30/2023   HDL 39 (L) 04/30/2023   LDLCALC 80 04/30/2023   ALT 48 (H) 04/30/2023   AST 68 (H) 04/30/2023   NA 142 04/30/2023   K 4.6 04/30/2023   CL 101 04/30/2023   CREATININE 1.06 (H) 04/30/2023   BUN 10 04/30/2023   CO2 23 04/30/2023   TSH 3.620 10/29/2022   HGBA1C 7.3 (H) 07/26/2023     Assessment & Plan:   Problem List Items Addressed This Visit       Endocrine   Type 2 diabetes mellitus without complication, without long-term current use of insulin (HCC) - Primary   Improving but not yet at goal. Continue Metformin. If not at goal at follow up, will add additional pharmacotherapy.        Genitourinary   Vaginal itching   Diflucan as directed.         Other   Back pain   Baclofen as directed.      Relevant Medications   baclofen (LIORESAL) 10 MG tablet   Hyperlipidemia   Stable. Would like LDL below 70. Continue Crestor.       Meds ordered this encounter  Medications   baclofen (LIORESAL) 10 MG tablet    Sig: Take 0.5-1 tablets (5-10 mg total) by mouth 3 (three) times daily as needed.    Dispense:  30 each    Refill:  0   fluconazole (DIFLUCAN) 150 MG tablet    Sig: Take 1 tablet (150 mg total) by mouth once for 1 dose. Repeat dose in 72 hours.    Dispense:  2 tablet    Refill:  0    Follow-up:  3 months  Yuridiana Formanek Adriana Simas DO [facility]

## 2023-08-04 DIAGNOSIS — N898 Other specified noninflammatory disorders of vagina: Secondary | ICD-10-CM | POA: Insufficient documentation

## 2023-08-04 NOTE — Assessment & Plan Note (Signed)
Diflucan as directed

## 2023-08-04 NOTE — Assessment & Plan Note (Signed)
Improving but not yet at goal. Continue Metformin. If not at goal at follow up, will add additional pharmacotherapy.

## 2023-08-04 NOTE — Assessment & Plan Note (Signed)
Stable. Would like LDL below 70. Continue Crestor.

## 2023-08-04 NOTE — Assessment & Plan Note (Signed)
Baclofen as directed.

## 2023-08-08 ENCOUNTER — Other Ambulatory Visit: Payer: Self-pay | Admitting: Family Medicine

## 2023-09-05 ENCOUNTER — Other Ambulatory Visit: Payer: Self-pay

## 2023-09-05 DIAGNOSIS — Z122 Encounter for screening for malignant neoplasm of respiratory organs: Secondary | ICD-10-CM

## 2023-09-05 DIAGNOSIS — Z87891 Personal history of nicotine dependence: Secondary | ICD-10-CM

## 2023-09-05 NOTE — Progress Notes (Signed)
Attempted to call patient to schedule follow-up LDCT. Unable to reach the patient at this time, detailed VM left asking that the patient return my call.  

## 2023-09-16 ENCOUNTER — Other Ambulatory Visit: Payer: Self-pay | Admitting: Family Medicine

## 2023-09-16 DIAGNOSIS — E785 Hyperlipidemia, unspecified: Secondary | ICD-10-CM

## 2023-09-16 MED ORDER — ROSUVASTATIN CALCIUM 10 MG PO TABS: 10.0000 mg | ORAL_TABLET | Freq: Every day | ORAL | 3 refills | Status: DC

## 2023-10-08 ENCOUNTER — Other Ambulatory Visit: Payer: Self-pay | Admitting: Family Medicine

## 2023-10-22 ENCOUNTER — Encounter: Payer: Self-pay | Admitting: Family Medicine

## 2023-10-23 ENCOUNTER — Other Ambulatory Visit: Payer: Self-pay | Admitting: Family Medicine

## 2023-10-25 ENCOUNTER — Other Ambulatory Visit: Payer: Self-pay

## 2023-10-25 DIAGNOSIS — Z79899 Other long term (current) drug therapy: Secondary | ICD-10-CM

## 2023-10-25 DIAGNOSIS — E785 Hyperlipidemia, unspecified: Secondary | ICD-10-CM

## 2023-10-25 DIAGNOSIS — E119 Type 2 diabetes mellitus without complications: Secondary | ICD-10-CM

## 2023-10-25 DIAGNOSIS — E039 Hypothyroidism, unspecified: Secondary | ICD-10-CM

## 2023-10-28 DIAGNOSIS — E119 Type 2 diabetes mellitus without complications: Secondary | ICD-10-CM | POA: Diagnosis not present

## 2023-10-28 DIAGNOSIS — Z79899 Other long term (current) drug therapy: Secondary | ICD-10-CM | POA: Diagnosis not present

## 2023-10-28 DIAGNOSIS — E785 Hyperlipidemia, unspecified: Secondary | ICD-10-CM | POA: Diagnosis not present

## 2023-10-28 DIAGNOSIS — E039 Hypothyroidism, unspecified: Secondary | ICD-10-CM | POA: Diagnosis not present

## 2023-10-29 LAB — COMPREHENSIVE METABOLIC PANEL
ALT: 35 IU/L — ABNORMAL HIGH (ref 0–32)
AST: 40 IU/L (ref 0–40)
Albumin: 4.7 g/dL (ref 3.9–4.9)
Alkaline Phosphatase: 71 IU/L (ref 44–121)
BUN/Creatinine Ratio: 13 (ref 12–28)
BUN: 13 mg/dL (ref 8–27)
Bilirubin Total: 0.4 mg/dL (ref 0.0–1.2)
CO2: 24 mmol/L (ref 20–29)
Calcium: 9.6 mg/dL (ref 8.7–10.3)
Chloride: 104 mmol/L (ref 96–106)
Creatinine, Ser: 1.02 mg/dL — ABNORMAL HIGH (ref 0.57–1.00)
Globulin, Total: 2.4 g/dL (ref 1.5–4.5)
Glucose: 148 mg/dL — ABNORMAL HIGH (ref 70–99)
Potassium: 4.3 mmol/L (ref 3.5–5.2)
Sodium: 143 mmol/L (ref 134–144)
Total Protein: 7.1 g/dL (ref 6.0–8.5)
eGFR: 61 mL/min/{1.73_m2} (ref >59)

## 2023-10-29 LAB — CBC WITH DIFFERENTIAL/PLATELET
Basophils Absolute: 0.1 10*3/uL (ref 0.0–0.2)
Basos: 1 %
EOS (ABSOLUTE): 0.2 10*3/uL (ref 0.0–0.4)
Eos: 3 %
Hematocrit: 45.4 % (ref 34.0–46.6)
Hemoglobin: 14.6 g/dL (ref 11.1–15.9)
Immature Grans (Abs): 0 10*3/uL (ref 0.0–0.1)
Immature Granulocytes: 0 %
Lymphocytes Absolute: 2.5 10*3/uL (ref 0.7–3.1)
Lymphs: 29 %
MCH: 30.8 pg (ref 26.6–33.0)
MCHC: 32.2 g/dL (ref 31.5–35.7)
MCV: 96 fL (ref 79–97)
Monocytes Absolute: 0.8 10*3/uL (ref 0.1–0.9)
Monocytes: 9 %
Neutrophils Absolute: 5.2 10*3/uL (ref 1.4–7.0)
Neutrophils: 58 %
Platelets: 260 10*3/uL (ref 150–450)
RBC: 4.74 x10E6/uL (ref 3.77–5.28)
RDW: 12.2 % (ref 11.7–15.4)
WBC: 8.8 10*3/uL (ref 3.4–10.8)

## 2023-10-29 LAB — LIPID PANEL
Chol/HDL Ratio: 3.6 ratio (ref 0.0–4.4)
Cholesterol, Total: 136 mg/dL (ref 100–199)
HDL: 38 mg/dL — ABNORMAL LOW (ref >39)
LDL Chol Calc (NIH): 66 mg/dL (ref 0–99)
Triglycerides: 195 mg/dL — ABNORMAL HIGH (ref 0–149)
VLDL Cholesterol Cal: 32 mg/dL (ref 5–40)

## 2023-10-29 LAB — MICROALBUMIN / CREATININE URINE RATIO
Creatinine, Urine: 237 mg/dL
Microalb/Creat Ratio: 5 mg/g{creat} (ref 0–29)
Microalbumin, Urine: 11.4 ug/mL

## 2023-10-29 LAB — TSH: TSH: 0.381 u[IU]/mL — ABNORMAL LOW (ref 0.450–4.500)

## 2023-10-29 LAB — HEMOGLOBIN A1C
Est. average glucose Bld gHb Est-mCnc: 143 mg/dL
Hgb A1c MFr Bld: 6.6 % — ABNORMAL HIGH (ref 4.8–5.6)

## 2023-10-31 ENCOUNTER — Ambulatory Visit: Payer: No Typology Code available for payment source | Admitting: Family Medicine

## 2023-10-31 DIAGNOSIS — E1169 Type 2 diabetes mellitus with other specified complication: Secondary | ICD-10-CM

## 2023-10-31 DIAGNOSIS — E039 Hypothyroidism, unspecified: Secondary | ICD-10-CM

## 2023-10-31 DIAGNOSIS — E785 Hyperlipidemia, unspecified: Secondary | ICD-10-CM | POA: Diagnosis not present

## 2023-10-31 DIAGNOSIS — E119 Type 2 diabetes mellitus without complications: Secondary | ICD-10-CM

## 2023-10-31 MED ORDER — SEMAGLUTIDE(0.25 OR 0.5MG/DOS) 2 MG/3ML ~~LOC~~ SOPN: PEN_INJECTOR | SUBCUTANEOUS | 0 refills | Status: DC

## 2023-10-31 MED ORDER — LEVOTHYROXINE SODIUM 88 MCG PO TABS: 88.0000 ug | ORAL_TABLET | Freq: Every day | ORAL | 1 refills | Status: DC

## 2023-10-31 NOTE — Patient Instructions (Signed)
 Levothyroxine decreased.  Ozempic sent in (we will see if they cover).  Follow up in 6 months.

## 2023-11-03 NOTE — Assessment & Plan Note (Signed)
LDL at goal.  Continue Crestor. 

## 2023-11-03 NOTE — Assessment & Plan Note (Signed)
 TSH suppressed. Decreasing Levothyroxine dose to 88 mcg.

## 2023-11-03 NOTE — Assessment & Plan Note (Signed)
 Stable. A1c at goal. Continue Metformin. Will see if Ozempic is covered/approved.

## 2023-11-03 NOTE — Progress Notes (Signed)
 Subjective:  Patient ID: Latasha Lindsey, female    DOB: 1955-08-15  Age: 68 y.o. MRN: [id number]  CC:  Follow up   HPI:  68 year old female with COPD, Hepatic steatosis, Hypothyroidism, DM-2, HLD presents for follow up.  Patient states that she is doing well.   LDL at goal on Crestor.   A1c has improved. Now 6.6. She is tolerating Metformin XR. She is interested in seeing if Ozempic would be covered to help with her diabetic control as well as her weight.   Labs revealed suppressed TSH. Need to decrease Synthroid dose.    Patient Active Problem List   Diagnosis Date Noted   Emphysema lung (HCC) 05/02/2022   Hepatic steatosis 08/31/2021   Type 2 diabetes mellitus without complication, without long-term current use of insulin (HCC) 05/08/2019   Hypothyroidism 01/11/2015   Hyperlipidemia     Social Hx   Social History   Socioeconomic History   Marital status: Divorced    Spouse name: Not on file   Number of children: Not on file   Years of education: Not on file   Highest education level: GED or equivalent  Occupational History   Not on file  Tobacco Use   Smoking status: Former    Current packs/day: 0.00    Average packs/day: 1 pack/day for 30.0 years (30.0 ttl pk-yrs)    Types: Cigarettes    Start date: 10/14/1979    Quit date: 10/13/2009    Years since quitting: 14.0   Smokeless tobacco: Never  Vaping Use   Vaping status: Never Used  Substance and Sexual Activity   Alcohol use: Yes    Alcohol/week: 1.0 standard drink of alcohol    Types: 1 Standard drinks or equivalent per week   Drug use: No   Sexual activity: Yes    Birth control/protection: Surgical  Other Topics Concern   Not on file  Social History Narrative   Not on file   Social Drivers of Health   Financial Resource Strain: Low Risk  (10/27/2023)   Overall Financial Resource Strain (CARDIA)    Difficulty of Paying Living Expenses: Not hard at all  Food Insecurity: No Food Insecurity (10/27/2023)    Hunger Vital Sign    Worried About Running Out of Food in the Last Year: Never true    Ran Out of Food in the Last Year: Never true  Transportation Needs: No Transportation Needs (10/27/2023)   PRAPARE - Administrator, Civil Service (Medical): No    Lack of Transportation (Non-Medical): No  Physical Activity: Insufficiently Active (10/27/2023)   Exercise Vital Sign    Days of Exercise per Week: 3 days    Minutes of Exercise per Session: 20 min  Stress: No Stress Concern Present (10/27/2023)   [employer] of Occupational Health - Occupational Stress Questionnaire    Feeling of Stress : Not at all  Social Connections: Moderately Isolated (10/27/2023)   Social Connection and Isolation Panel [NHANES]    Frequency of Communication with Friends and Family: More than three times a week    Frequency of Social Gatherings with Friends and Family: More than three times a week    Attends Religious Services: Never    Database administrator or Organizations: No    Attends Engineer, structural: Not on file    Marital Status: Living with partner    Review of Systems  Constitutional: Negative.   Cardiovascular: Negative.     Objective:  BP 139/85   Pulse 68   Temp 98.4 F (36.9 C)   Ht 5\' 3"  (1.6 m)   Wt 199 lb (90.3 kg)   SpO2 99%   BMI 35.25 kg/m      10/31/2023   10:41 AM 08/02/2023   11:30 AM 08/02/2023   10:30 AM  BP/Weight  Systolic BP 139 130 146  Diastolic BP 85 75 81  Wt. (Lbs) 199  204  BMI 35.25 kg/m2  36.14 kg/m2    Physical Exam Vitals and nursing note reviewed.  Constitutional:      General: She is not in acute distress.    Appearance: Normal appearance.  HENT:     Head: Normocephalic and atraumatic.  Eyes:     General:        Right eye: No discharge.        Left eye: No discharge.     Conjunctiva/sclera: Conjunctivae normal.  Cardiovascular:     Rate and Rhythm: Normal rate and regular rhythm.  Pulmonary:     Effort: Pulmonary  effort is normal.     Breath sounds: Normal breath sounds. No wheezing, rhonchi or rales.  Neurological:     Mental Status: She is alert.  Psychiatric:        Mood and Affect: Mood normal.        Behavior: Behavior normal.     Lab Results  Component Value Date   WBC 8.8 10/28/2023   HGB 14.6 10/28/2023   HCT 45.4 10/28/2023   PLT 260 10/28/2023   GLUCOSE 148 (H) 10/28/2023   CHOL 136 10/28/2023   TRIG 195 (H) 10/28/2023   HDL 38 (L) 10/28/2023   LDLCALC 66 10/28/2023   ALT 35 (H) 10/28/2023   AST 40 10/28/2023   NA 143 10/28/2023   K 4.3 10/28/2023   CL 104 10/28/2023   CREATININE 1.02 (H) 10/28/2023   BUN 13 10/28/2023   CO2 24 10/28/2023   TSH 0.381 (L) 10/28/2023   HGBA1C 6.6 (H) 10/28/2023     Assessment & Plan:  Hypothyroidism, unspecified type Assessment & Plan: TSH suppressed. Decreasing Levothyroxine dose to 88 mcg.  Orders: -     Levothyroxine Sodium; Take 1 tablet (88 mcg total) by mouth daily before breakfast.  Dispense: 90 tablet; Refill: 1  Type 2 diabetes mellitus without complication, without long-term current use of insulin (HCC) Assessment & Plan: Stable. A1c at goal. Continue Metformin. Will see if Ozempic is covered/approved.   Orders: -     Semaglutide(0.25 or 0.5MG /DOS); 0.25 mg weekly for 4 weeks.  Dispense: 3 mL; Refill: 0  Hyperlipidemia, unspecified hyperlipidemia type Assessment & Plan: LDL at goal. Continue Crestor.     Follow-up:  6 months  Shevawn Langenberg Adriana Simas DO [facility]

## 2023-11-05 ENCOUNTER — Other Ambulatory Visit: Payer: Self-pay | Admitting: Family Medicine

## 2023-11-05 ENCOUNTER — Telehealth: Payer: Self-pay | Admitting: *Deleted

## 2023-11-05 DIAGNOSIS — E119 Type 2 diabetes mellitus without complications: Secondary | ICD-10-CM

## 2023-11-05 NOTE — Telephone Encounter (Signed)
 Ozempic is a tier 3 medication for patient and has a $600 copay- patient states she can not afford that and would like a script for her metformin ER sent to [facility] in [place]

## 2023-11-06 ENCOUNTER — Other Ambulatory Visit: Payer: Self-pay | Admitting: Family Medicine

## 2023-11-06 DIAGNOSIS — E119 Type 2 diabetes mellitus without complications: Secondary | ICD-10-CM

## 2023-11-06 MED ORDER — METFORMIN HCL ER 750 MG PO TB24: 750.0000 mg | ORAL_TABLET | Freq: Two times a day (BID) | ORAL | 3 refills | Status: AC

## 2023-11-07 ENCOUNTER — Ambulatory Visit (HOSPITAL_COMMUNITY)
Admission: RE | Admit: 2023-11-07 | Discharge: 2023-11-07 | Disposition: A | Discharge status: Home / Self Care | Payer: No Typology Code available for payment source | Source: Ambulatory Visit | Attending: Oncology | Admitting: Oncology

## 2023-11-07 DIAGNOSIS — Z122 Encounter for screening for malignant neoplasm of respiratory organs: Secondary | ICD-10-CM | POA: Diagnosis not present

## 2023-11-07 DIAGNOSIS — Z87891 Personal history of nicotine dependence: Secondary | ICD-10-CM | POA: Insufficient documentation

## 2023-11-07 NOTE — Telephone Encounter (Signed)
Coral Spikes, DO  Medication sent in.

## 2023-12-30 ENCOUNTER — Encounter: Payer: Self-pay | Admitting: Family Medicine

## 2023-12-31 ENCOUNTER — Other Ambulatory Visit: Payer: Self-pay | Admitting: Family Medicine

## 2023-12-31 DIAGNOSIS — E039 Hypothyroidism, unspecified: Secondary | ICD-10-CM

## 2024-01-01 DIAGNOSIS — E039 Hypothyroidism, unspecified: Secondary | ICD-10-CM | POA: Diagnosis not present

## 2024-01-02 LAB — TSH+FREE T4
Free T4: 1.28 ng/dL (ref 0.82–1.77)
TSH: 2.76 u[IU]/mL (ref 0.450–4.500)

## 2024-01-06 ENCOUNTER — Ambulatory Visit: Payer: Self-pay | Admitting: Family Medicine

## 2024-02-07 ENCOUNTER — Encounter: Payer: Self-pay | Admitting: *Deleted

## 2024-02-10 ENCOUNTER — Encounter: Payer: Self-pay | Admitting: *Deleted

## 2024-02-10 NOTE — Progress Notes (Signed)
 Patient notified via mail of LDCT lung cancer screening results with recommendations to follow up in 12 months.  Also notified of incidental findings and need to follow up with PCP.  Patient's referring provider was sent a copy of results.     IMPRESSION: Lung-RADS 2, benign appearance or behavior. Continue annual screening with low-dose chest CT without contrast in 12 months.   Aortic Atherosclerosis (ICD10-I70.0) and Emphysema (ICD10-J43.9).   Age advanced coronary artery atherosclerosis. Recommend assessment of coronary risk factors.

## 2024-03-04 DIAGNOSIS — M546 Pain in thoracic spine: Secondary | ICD-10-CM | POA: Diagnosis not present

## 2024-03-19 DIAGNOSIS — M546 Pain in thoracic spine: Secondary | ICD-10-CM | POA: Diagnosis not present

## 2024-03-20 DIAGNOSIS — E785 Hyperlipidemia, unspecified: Secondary | ICD-10-CM | POA: Diagnosis not present

## 2024-03-20 DIAGNOSIS — M545 Low back pain, unspecified: Secondary | ICD-10-CM | POA: Diagnosis not present

## 2024-03-20 DIAGNOSIS — Z008 Encounter for other general examination: Secondary | ICD-10-CM | POA: Diagnosis not present

## 2024-03-20 DIAGNOSIS — N182 Chronic kidney disease, stage 2 (mild): Secondary | ICD-10-CM | POA: Diagnosis not present

## 2024-03-20 DIAGNOSIS — J439 Emphysema, unspecified: Secondary | ICD-10-CM | POA: Diagnosis not present

## 2024-03-20 DIAGNOSIS — Z6834 Body mass index (BMI) 34.0-34.9, adult: Secondary | ICD-10-CM | POA: Diagnosis not present

## 2024-03-20 DIAGNOSIS — E1169 Type 2 diabetes mellitus with other specified complication: Secondary | ICD-10-CM | POA: Diagnosis not present

## 2024-03-20 DIAGNOSIS — M1990 Unspecified osteoarthritis, unspecified site: Secondary | ICD-10-CM | POA: Diagnosis not present

## 2024-03-20 DIAGNOSIS — E039 Hypothyroidism, unspecified: Secondary | ICD-10-CM | POA: Diagnosis not present

## 2024-03-20 DIAGNOSIS — E1122 Type 2 diabetes mellitus with diabetic chronic kidney disease: Secondary | ICD-10-CM | POA: Diagnosis not present

## 2024-03-20 DIAGNOSIS — E669 Obesity, unspecified: Secondary | ICD-10-CM | POA: Diagnosis not present

## 2024-04-01 DIAGNOSIS — M546 Pain in thoracic spine: Secondary | ICD-10-CM | POA: Diagnosis not present

## 2024-04-09 DIAGNOSIS — M546 Pain in thoracic spine: Secondary | ICD-10-CM | POA: Diagnosis not present

## 2024-04-22 ENCOUNTER — Encounter: Payer: Self-pay | Admitting: Family Medicine

## 2024-04-22 DIAGNOSIS — G8929 Other chronic pain: Secondary | ICD-10-CM | POA: Diagnosis not present

## 2024-04-22 DIAGNOSIS — M546 Pain in thoracic spine: Secondary | ICD-10-CM | POA: Diagnosis not present

## 2024-04-22 DIAGNOSIS — M7918 Myalgia, other site: Secondary | ICD-10-CM | POA: Diagnosis not present

## 2024-04-25 ENCOUNTER — Other Ambulatory Visit: Payer: Self-pay | Admitting: Family Medicine

## 2024-04-25 DIAGNOSIS — E039 Hypothyroidism, unspecified: Secondary | ICD-10-CM

## 2024-04-25 DIAGNOSIS — E785 Hyperlipidemia, unspecified: Secondary | ICD-10-CM

## 2024-04-25 DIAGNOSIS — Z130 Encounter for screening for diseases of the blood and blood-forming organs and certain disorders involving the immune mechanism: Secondary | ICD-10-CM

## 2024-04-25 DIAGNOSIS — E119 Type 2 diabetes mellitus without complications: Secondary | ICD-10-CM

## 2024-04-27 DIAGNOSIS — E119 Type 2 diabetes mellitus without complications: Secondary | ICD-10-CM | POA: Diagnosis not present

## 2024-04-27 DIAGNOSIS — Z130 Encounter for screening for diseases of the blood and blood-forming organs and certain disorders involving the immune mechanism: Secondary | ICD-10-CM | POA: Diagnosis not present

## 2024-04-27 DIAGNOSIS — E039 Hypothyroidism, unspecified: Secondary | ICD-10-CM | POA: Diagnosis not present

## 2024-04-27 DIAGNOSIS — E785 Hyperlipidemia, unspecified: Secondary | ICD-10-CM | POA: Diagnosis not present

## 2024-04-28 LAB — CMP14+EGFR
ALT: 26 IU/L (ref 0–32)
AST: 38 IU/L (ref 0–40)
Albumin: 4.6 g/dL (ref 3.9–4.9)
Alkaline Phosphatase: 76 IU/L (ref 51–125)
BUN/Creatinine Ratio: 16 (ref 12–28)
BUN: 17 mg/dL (ref 8–27)
Bilirubin Total: 0.4 mg/dL (ref 0.0–1.2)
CO2: 21 mmol/L (ref 20–29)
Calcium: 9.9 mg/dL (ref 8.7–10.3)
Chloride: 102 mmol/L (ref 96–106)
Creatinine, Ser: 1.09 mg/dL — ABNORMAL HIGH (ref 0.57–1.00)
Globulin, Total: 2.3 g/dL (ref 1.5–4.5)
Glucose: 144 mg/dL — ABNORMAL HIGH (ref 70–99)
Potassium: 5.5 mmol/L — ABNORMAL HIGH (ref 3.5–5.2)
Sodium: 140 mmol/L (ref 134–144)
Total Protein: 6.9 g/dL (ref 6.0–8.5)
eGFR: 56 mL/min/1.73 — ABNORMAL LOW (ref >59)

## 2024-04-28 LAB — TSH: TSH: 2.35 u[IU]/mL (ref 0.450–4.500)

## 2024-04-28 LAB — MICROALBUMIN / CREATININE URINE RATIO
Creatinine, Urine: 124.8 mg/dL
Microalb/Creat Ratio: 3 mg/g{creat} (ref 0–29)
Microalbumin, Urine: 3.5 ug/mL

## 2024-04-28 LAB — CBC
Hematocrit: 46.1 % (ref 34.0–46.6)
Hemoglobin: 14.5 g/dL (ref 11.1–15.9)
MCH: 30.5 pg (ref 26.6–33.0)
MCHC: 31.5 g/dL (ref 31.5–35.7)
MCV: 97 fL (ref 79–97)
Platelets: 245 x10E3/uL (ref 150–450)
RBC: 4.75 x10E6/uL (ref 3.77–5.28)
RDW: 13.2 % (ref 11.7–15.4)
WBC: 8 x10E3/uL (ref 3.4–10.8)

## 2024-04-28 LAB — LIPID PANEL
Chol/HDL Ratio: 3.6 ratio (ref 0.0–4.4)
Cholesterol, Total: 141 mg/dL (ref 100–199)
HDL: 39 mg/dL — ABNORMAL LOW (ref >39)
LDL Chol Calc (NIH): 66 mg/dL (ref 0–99)
Triglycerides: 217 mg/dL — ABNORMAL HIGH (ref 0–149)
VLDL Cholesterol Cal: 36 mg/dL (ref 5–40)

## 2024-04-28 LAB — HEMOGLOBIN A1C
Est. average glucose Bld gHb Est-mCnc: 148 mg/dL
Hgb A1c MFr Bld: 6.8 % — ABNORMAL HIGH (ref 4.8–5.6)

## 2024-04-30 ENCOUNTER — Other Ambulatory Visit: Payer: Self-pay | Admitting: Family Medicine

## 2024-04-30 ENCOUNTER — Ambulatory Visit: Admitting: Family Medicine

## 2024-04-30 ENCOUNTER — Encounter: Payer: Self-pay | Admitting: Family Medicine

## 2024-04-30 VITALS — BP 138/86 | HR 56 | Ht 63.0 in | Wt 201.0 lb

## 2024-04-30 DIAGNOSIS — Z780 Asymptomatic menopausal state: Secondary | ICD-10-CM

## 2024-04-30 DIAGNOSIS — E785 Hyperlipidemia, unspecified: Secondary | ICD-10-CM

## 2024-04-30 DIAGNOSIS — E039 Hypothyroidism, unspecified: Secondary | ICD-10-CM

## 2024-04-30 DIAGNOSIS — Z23 Encounter for immunization: Secondary | ICD-10-CM | POA: Diagnosis not present

## 2024-04-30 DIAGNOSIS — E119 Type 2 diabetes mellitus without complications: Secondary | ICD-10-CM | POA: Diagnosis not present

## 2024-04-30 MED ORDER — LEVOTHYROXINE SODIUM 88 MCG PO TABS: 88.0000 ug | ORAL_TABLET | Freq: Every day | ORAL | 3 refills | Status: AC

## 2024-04-30 NOTE — Patient Instructions (Signed)
 Medication refilled.  Call [phone] to schedule Dexa.  Follow up in 6 months.

## 2024-05-03 NOTE — Assessment & Plan Note (Signed)
 Stable on current dose of Synthroid.  Continue.

## 2024-05-03 NOTE — Assessment & Plan Note (Signed)
LDL at goal on Crestor.  Continue.

## 2024-05-03 NOTE — Progress Notes (Signed)
 Subjective:  Patient ID: Latasha Lindsey, female    DOB: 05/18/1956  Age: 68 y.o. MRN: [id number]  CC:   Chief Complaint  Patient presents with   6 month follow up    Pt exp recent loss of her sister    HPI:  68 year old female presents for follow up.  Sister recently passed from COVID.   Needs Dexa scan. She is amendable to this.  Recent labs reviewed. LDL at goal on Crestor . A1c at goal on Metformin . Hypothyroidism stable.   Patient Active Problem List   Diagnosis Date Noted   Emphysema lung (HCC) 05/02/2022   Hepatic steatosis 08/31/2021   Type 2 diabetes mellitus without complication, without long-term current use of insulin (HCC) 05/08/2019   Hypothyroidism 01/11/2015   Hyperlipidemia     Social Hx   Social History   Socioeconomic History   Marital status: Divorced    Spouse name: Not on file   Number of children: Not on file   Years of education: Not on file   Highest education level: GED or equivalent  Occupational History   Not on file  Tobacco Use   Smoking status: Former    Current packs/day: 0.00    Average packs/day: 1 pack/day for 30.0 years (30.0 ttl pk-yrs)    Types: Cigarettes    Start date: 10/14/1979    Quit date: 10/13/2009    Years since quitting: 14.5   Smokeless tobacco: Never  Vaping Use   Vaping status: Never Used  Substance and Sexual Activity   Alcohol use: Yes    Alcohol/week: 1.0 standard drink of alcohol    Types: 1 Standard drinks or equivalent per week   Drug use: No   Sexual activity: Yes    Birth control/protection: Surgical  Other Topics Concern   Not on file  Social History Narrative   Not on file   Social Drivers of Health   Financial Resource Strain: Low Risk  (04/26/2024)   Overall Financial Resource Strain (CARDIA)    Difficulty of Paying Living Expenses: Not hard at all  Food Insecurity: No Food Insecurity (04/26/2024)   Hunger Vital Sign    Worried About Running Out of Food in the Last Year: Never true    Ran  Out of Food in the Last Year: Never true  Transportation Needs: No Transportation Needs (04/26/2024)   PRAPARE - Administrator, Civil Service (Medical): No    Lack of Transportation (Non-Medical): No  Physical Activity: Insufficiently Active (04/26/2024)   Exercise Vital Sign    Days of Exercise per Week: 2 days    Minutes of Exercise per Session: 20 min  Stress: No Stress Concern Present (04/26/2024)   [employer] of Occupational Health - Occupational Stress Questionnaire    Feeling of Stress: Not at all  Social Connections: Socially Isolated (04/26/2024)   Social Connection and Isolation Panel    Frequency of Communication with Friends and Family: More than three times a week    Frequency of Social Gatherings with Friends and Family: Three times a week    Attends Religious Services: Never    Active Member of Clubs or Organizations: No    Attends Engineer, structural: Not on file    Marital Status: Divorced    Review of Systems  Respiratory: Negative.    Cardiovascular: Negative.     Objective:  BP 138/86   Pulse (!) 56   Ht 5' 3 (1.6 m)   Wt 201  lb (91.2 kg)   SpO2 100%   BMI 35.61 kg/m      04/30/2024    1:44 PM 10/31/2023   10:41 AM 08/02/2023   11:30 AM  BP/Weight  Systolic BP 138 139 130  Diastolic BP 86 85 75  Wt. (Lbs) 201 199   BMI 35.61 kg/m2 35.25 kg/m2     Physical Exam Vitals and nursing note reviewed.  Constitutional:      General: She is not in acute distress.    Appearance: Normal appearance.  HENT:     Head: Normocephalic and atraumatic.  Eyes:     General:        Right eye: No discharge.        Left eye: No discharge.     Conjunctiva/sclera: Conjunctivae normal.  Cardiovascular:     Rate and Rhythm: Normal rate and regular rhythm.  Pulmonary:     Effort: Pulmonary effort is normal.     Breath sounds: Normal breath sounds. No wheezing, rhonchi or rales.  Neurological:     Mental Status: She is alert.   Psychiatric:        Mood and Affect: Mood normal.        Behavior: Behavior normal.     Lab Results  Component Value Date   WBC 8.0 04/27/2024   HGB 14.5 04/27/2024   HCT 46.1 04/27/2024   PLT 245 04/27/2024   GLUCOSE 144 (H) 04/27/2024   CHOL 141 04/27/2024   TRIG 217 (H) 04/27/2024   HDL 39 (L) 04/27/2024   LDLCALC 66 04/27/2024   ALT 26 04/27/2024   AST 38 04/27/2024   NA 140 04/27/2024   K 5.5 (H) 04/27/2024   CL 102 04/27/2024   CREATININE 1.09 (H) 04/27/2024   BUN 17 04/27/2024   CO2 21 04/27/2024   TSH 2.350 04/27/2024   HGBA1C 6.8 (H) 04/27/2024     Assessment & Plan:  Type 2 diabetes mellitus without complication, without long-term current use of insulin (HCC) Assessment & Plan: Stable. Continue Metformin .   Immunization due -     Flu vaccine HIGH DOSE PF(Fluzone Trivalent)  Hypothyroidism, unspecified type Assessment & Plan: Stable on current dose of Synthroid . Continue.  Orders: -     Levothyroxine  Sodium; Take 1 tablet (88 mcg total) by mouth daily before breakfast.  Dispense: 90 tablet; Refill: 3  Post-menopausal -     DG Bone Density  Hyperlipidemia, unspecified hyperlipidemia type Assessment & Plan: LDL at goal on Crestor . Continue.     Follow-up:  6 months  Ahlam Piscitelli Bluford DO [facility]

## 2024-05-03 NOTE — Assessment & Plan Note (Signed)
Stable. Continue Metformin. 

## 2024-05-05 ENCOUNTER — Other Ambulatory Visit (HOSPITAL_COMMUNITY): Payer: Self-pay | Admitting: Family Medicine

## 2024-05-05 DIAGNOSIS — Z1231 Encounter for screening mammogram for malignant neoplasm of breast: Secondary | ICD-10-CM

## 2024-06-08 ENCOUNTER — Ambulatory Visit (HOSPITAL_COMMUNITY)
Admission: RE | Admit: 2024-06-08 | Discharge: 2024-06-08 | Disposition: A | Discharge status: Home / Self Care | Source: Ambulatory Visit | Attending: Family Medicine | Admitting: Family Medicine

## 2024-06-08 ENCOUNTER — Encounter (HOSPITAL_COMMUNITY): Payer: Self-pay

## 2024-06-08 ENCOUNTER — Ambulatory Visit: Payer: Self-pay | Admitting: Family Medicine

## 2024-06-08 DIAGNOSIS — Z780 Asymptomatic menopausal state: Secondary | ICD-10-CM | POA: Diagnosis present

## 2024-06-08 DIAGNOSIS — Z1231 Encounter for screening mammogram for malignant neoplasm of breast: Secondary | ICD-10-CM | POA: Insufficient documentation

## 2024-06-16 ENCOUNTER — Ambulatory Visit: Payer: Self-pay | Admitting: Family Medicine

## 2024-06-16 ENCOUNTER — Encounter: Payer: Self-pay | Admitting: Family Medicine

## 2024-06-16 VITALS — BP 140/80 | HR 67 | Ht 64.0 in | Wt 202.0 lb

## 2024-06-16 DIAGNOSIS — M810 Age-related osteoporosis without current pathological fracture: Secondary | ICD-10-CM | POA: Diagnosis not present

## 2024-06-16 MED ORDER — ALENDRONATE SODIUM 70 MG PO TABS: 70.0000 mg | ORAL_TABLET | ORAL | 3 refills | Status: AC

## 2024-06-16 NOTE — Assessment & Plan Note (Addendum)
 Discussed calcium  and vitamin D . Weight bearing exercise. Starting Fosamax.

## 2024-06-16 NOTE — Progress Notes (Signed)
 Subjective:  Patient ID: Latasha Lindsey, female    DOB: [birth year]  Age: 68 y.o. MRN: [id number]  CC:   Chief Complaint  Patient presents with   Results    Discuss bone density results     HPI:  68 year old female presents to discuss recent Dexa scan.  Osteoporosis on Dexa. She is taking Vitamin D . Not taking calcium . Would like to discuss treatment options today.  Patient Active Problem List   Diagnosis Date Noted   Osteoporosis 06/16/2024   Emphysema lung (HCC) 05/02/2022   Hepatic steatosis 08/31/2021   Type 2 diabetes mellitus without complication, without long-term current use of insulin (HCC) 05/08/2019   Hypothyroidism 01/11/2015   Hyperlipidemia     Social Hx   Social History   Socioeconomic History   Marital status: Divorced    Spouse name: Not on file   Number of children: Not on file   Years of education: Not on file   Highest education level: GED or equivalent  Occupational History   Not on file  Tobacco Use   Smoking status: Former    Current packs/day: 0.00    Average packs/day: 1 pack/day for 30.0 years (30.0 ttl pk-yrs)    Types: Cigarettes    Start date: 10/14/1979    Quit date: 10/13/2009    Years since quitting: 14.6   Smokeless tobacco: Never  Vaping Use   Vaping status: Never Used  Substance and Sexual Activity   Alcohol use: Yes    Alcohol/week: 1.0 standard drink of alcohol    Types: 1 Standard drinks or equivalent per week   Drug use: No   Sexual activity: Yes    Birth control/protection: Surgical  Other Topics Concern   Not on file  Social History Narrative   Not on file   Social Drivers of Health   Financial Resource Strain: Low Risk  (06/12/2024)   Overall Financial Resource Strain (CARDIA)    Difficulty of Paying Living Expenses: Not hard at all  Food Insecurity: No Food Insecurity (06/12/2024)   Hunger Vital Sign    Worried About Running Out of Food in the Last Year: Never true    Ran Out of Food in the Last Year: Never  true  Transportation Needs: No Transportation Needs (06/12/2024)   PRAPARE - Administrator, Civil Service (Medical): No    Lack of Transportation (Non-Medical): No  Physical Activity: Insufficiently Active (06/12/2024)   Exercise Vital Sign    Days of Exercise per Week: 1 day    Minutes of Exercise per Session: 20 min  Stress: No Stress Concern Present (06/12/2024)   [employer] of Occupational Health - Occupational Stress Questionnaire    Feeling of Stress: Only a little  Social Connections: Moderately Isolated (06/12/2024)   Social Connection and Isolation Panel    Frequency of Communication with Friends and Family: More than three times a week    Frequency of Social Gatherings with Friends and Family: More than three times a week    Attends Religious Services: Never    Database Administrator or Organizations: No    Attends Engineer, Structural: Not on file    Marital Status: Living with partner    Review of Systems Per HPI  Objective:  BP (!) 140/80   Pulse 67   Ht 5' 4 (1.626 m)   Wt 202 lb (91.6 kg)   SpO2 97%   BMI 34.67 kg/m      06/16/2024  2:08 PM 06/16/2024    1:39 PM 04/30/2024    1:44 PM  BP/Weight  Systolic BP 140 160 138  Diastolic BP 80 84 86  Wt. (Lbs)  202 201  BMI  34.67 kg/m2 35.61 kg/m2    Physical Exam Vitals and nursing note reviewed.  Constitutional:      General: She is not in acute distress. HENT:     Head: Normocephalic and atraumatic.  Pulmonary:     Effort: Pulmonary effort is normal. No respiratory distress.  Neurological:     Mental Status: She is alert.  Psychiatric:        Mood and Affect: Mood normal.        Behavior: Behavior normal.     Lab Results  Component Value Date   WBC 8.0 04/27/2024   HGB 14.5 04/27/2024   HCT 46.1 04/27/2024   PLT 245 04/27/2024   GLUCOSE 144 (H) 04/27/2024   CHOL 141 04/27/2024   TRIG 217 (H) 04/27/2024   HDL 39 (L) 04/27/2024   LDLCALC 66 04/27/2024   ALT  26 04/27/2024   AST 38 04/27/2024   NA 140 04/27/2024   K 5.5 (H) 04/27/2024   CL 102 04/27/2024   CREATININE 1.09 (H) 04/27/2024   BUN 17 04/27/2024   CO2 21 04/27/2024   TSH 2.350 04/27/2024   HGBA1C 6.8 (H) 04/27/2024     Assessment & Plan:  Age-related osteoporosis without current pathological fracture Assessment & Plan: Discussed calcium  and vitamin D . Weight bearing exercise. Starting Fosamax.  Orders: -     Alendronate Sodium; Take 1 tablet (70 mg total) by mouth every 7 (seven) days. Take with a full glass of water on an empty stomach.  Dispense: 12 tablet; Refill: 3    Follow-up:  As previously scheduled.   Jacqulyn Ahle DO [facility]

## 2024-06-16 NOTE — Patient Instructions (Signed)
 Medication as prescribed.  Take care  Dr. Adriana Simas

## 2024-06-18 ENCOUNTER — Other Ambulatory Visit: Payer: Self-pay | Admitting: Family Medicine

## 2024-06-18 ENCOUNTER — Encounter: Payer: Self-pay | Admitting: Family Medicine

## 2024-06-18 DIAGNOSIS — M810 Age-related osteoporosis without current pathological fracture: Secondary | ICD-10-CM

## 2024-06-27 LAB — VITAMIN D 25 HYDROXY (VIT D DEFICIENCY, FRACTURES): Vit D, 25-Hydroxy: 49.2 ng/mL (ref 30.0–100.0)

## 2024-06-28 ENCOUNTER — Ambulatory Visit: Payer: Self-pay | Admitting: Family Medicine

## 2024-08-21 ENCOUNTER — Other Ambulatory Visit: Payer: Self-pay | Admitting: *Deleted

## 2024-08-21 DIAGNOSIS — Z122 Encounter for screening for malignant neoplasm of respiratory organs: Secondary | ICD-10-CM

## 2024-08-21 DIAGNOSIS — Z87891 Personal history of nicotine dependence: Secondary | ICD-10-CM

## 2024-08-24 ENCOUNTER — Ambulatory Visit: Admitting: Family Medicine

## 2024-08-24 VITALS — BP 126/77 | HR 71 | Temp 98.1°F | Ht 64.0 in | Wt 201.0 lb

## 2024-08-24 DIAGNOSIS — K219 Gastro-esophageal reflux disease without esophagitis: Secondary | ICD-10-CM | POA: Diagnosis not present

## 2024-08-24 DIAGNOSIS — E785 Hyperlipidemia, unspecified: Secondary | ICD-10-CM | POA: Diagnosis not present

## 2024-08-24 MED ORDER — ROSUVASTATIN CALCIUM 10 MG PO TABS: 10.0000 mg | ORAL_TABLET | Freq: Every day | ORAL | 3 refills | Status: AC

## 2024-08-24 MED ORDER — PANTOPRAZOLE SODIUM 40 MG PO TBEC: 40.0000 mg | DELAYED_RELEASE_TABLET | Freq: Every day | ORAL | 1 refills | Status: AC

## 2024-08-24 NOTE — Progress Notes (Signed)
 "  Subjective:  Patient ID: Latasha Lindsey, female    DOB: 1956/07/04  Age: 69 y.o. MRN: [id number]  CC:   Chief Complaint  Patient presents with   possible reflux    Burning in throat and chest takes pepto bismol   discuss cholesterol med    HPI:  69 year old female presents for evaluation of the above.  Has recently been experiencing burning in the chest.  There is especially after eating certain foods.  Responded to Pepto-Bismol and Tums.  Suspected GERD.  Will discuss today.  Patient also has concerns regarding statin.  She states that she has read an article regarding statins and co-Q10.  She states she has intermittent cramping and muscle aches.  It is unsure if this is related to statin.  She would like to discuss this today.  LDL is well-controlled on Crestor  10 mg daily.   Patient Active Problem List   Diagnosis Date Noted   GERD (gastroesophageal reflux disease) 08/24/2024   Osteoporosis 06/16/2024   Emphysema lung (HCC) 05/02/2022   Hepatic steatosis 08/31/2021   Type 2 diabetes mellitus without complication, without long-term current use of insulin (HCC) 05/08/2019   Hypothyroidism 01/11/2015   Hyperlipidemia     Social Hx   Social History   Socioeconomic History   Marital status: Divorced    Spouse name: Not on file   Number of children: Not on file   Years of education: Not on file   Highest education level: GED or equivalent  Occupational History   Not on file  Tobacco Use   Smoking status: Former    Current packs/day: 0.00    Average packs/day: 1 pack/day for 30.0 years (30.0 ttl pk-yrs)    Types: Cigarettes    Start date: 10/14/1979    Quit date: 10/13/2009    Years since quitting: 14.8   Smokeless tobacco: Never  Vaping Use   Vaping status: Never Used  Substance and Sexual Activity   Alcohol use: Yes    Alcohol/week: 1.0 standard drink of alcohol    Types: 1 Standard drinks or equivalent per week   Drug use: No   Sexual activity: Yes    Birth  control/protection: Surgical  Other Topics Concern   Not on file  Social History Narrative   Not on file   Social Drivers of Health   Tobacco Use: Medium Risk (06/16/2024)   Patient History    Smoking Tobacco Use: Former    Smokeless Tobacco Use: Never    Passive Exposure: Not on Actuary Strain: Low Risk (06/12/2024)   Overall Financial Resource Strain (CARDIA)    Difficulty of Paying Living Expenses: Not hard at all  Food Insecurity: No Food Insecurity (06/12/2024)   Epic    Worried About Programme Researcher, Broadcasting/film/video in the Last Year: Never true    Ran Out of Food in the Last Year: Never true  Transportation Needs: No Transportation Needs (06/12/2024)   Epic    Lack of Transportation (Medical): No    Lack of Transportation (Non-Medical): No  Physical Activity: Insufficiently Active (06/12/2024)   Exercise Vital Sign    Days of Exercise per Week: 1 day    Minutes of Exercise per Session: 20 min  Stress: No Stress Concern Present (06/12/2024)   [employer] of Occupational Health - Occupational Stress Questionnaire    Feeling of Stress: Only a little  Social Connections: Moderately Isolated (06/12/2024)   Social Connection and Isolation Panel    Frequency of  Communication with Friends and Family: More than three times a week    Frequency of Social Gatherings with Friends and Family: More than three times a week    Attends Religious Services: Never    Database Administrator or Organizations: No    Attends Engineer, Structural: Not on file    Marital Status: Living with partner  Depression (PHQ2-9): Low Risk (08/24/2024)   Depression (PHQ2-9)    PHQ-2 Score: 2  Alcohol Screen: Low Risk (06/12/2024)   Alcohol Screen    Last Alcohol Screening Score (AUDIT): 4  Housing: Low Risk (06/12/2024)   Epic    Unable to Pay for Housing in the Last Year: No    Number of Times Moved in the Last Year: 0    Homeless in the Last Year: No  Utilities: Not on file   Health Literacy: Not on file    Review of Systems Per HPI  Objective:  BP 126/77   Pulse 71   Temp 98.1 F (36.7 C)   Ht 5' 4 (1.626 m)   Wt 201 lb (91.2 kg)   SpO2 98%   BMI 34.50 kg/m      08/24/2024   11:21 AM 06/16/2024    2:08 PM 06/16/2024    1:39 PM  BP/Weight  Systolic BP 126 140 160  Diastolic BP 77 80 84  Wt. (Lbs) 201  202  BMI 34.5 kg/m2  34.67 kg/m2    Physical Exam Vitals and nursing note reviewed.  Constitutional:      Appearance: Normal appearance.  HENT:     Head: Normocephalic and atraumatic.  Cardiovascular:     Rate and Rhythm: Normal rate and regular rhythm.  Pulmonary:     Effort: Pulmonary effort is normal.     Breath sounds: Rhonchi present. No wheezing or rales.  Neurological:     Mental Status: She is alert.     Lab Results  Component Value Date   WBC 8.0 04/27/2024   HGB 14.5 04/27/2024   HCT 46.1 04/27/2024   PLT 245 04/27/2024   GLUCOSE 144 (H) 04/27/2024   CHOL 141 04/27/2024   TRIG 217 (H) 04/27/2024   HDL 39 (L) 04/27/2024   LDLCALC 66 04/27/2024   ALT 26 04/27/2024   AST 38 04/27/2024   NA 140 04/27/2024   K 5.5 (H) 04/27/2024   CL 102 04/27/2024   CREATININE 1.09 (H) 04/27/2024   BUN 17 04/27/2024   CO2 21 04/27/2024   TSH 2.350 04/27/2024   HGBA1C 6.8 (H) 04/27/2024     Assessment & Plan:  Hyperlipidemia, unspecified hyperlipidemia type Assessment & Plan: At goal.  Advised to continue statin at this time.  She has intermittent myalgias and cramping which I do not feel is due to statin.  Current guidelines do not recommend the use of co-Q10.  Orders: -     Rosuvastatin  Calcium ; Take 1 tablet (10 mg total) by mouth daily. for cholesterol.  Dispense: 90 tablet; Refill: 3  Gastroesophageal reflux disease without esophagitis Assessment & Plan: Uncontrolled.  Starting Protonix .  Orders: -     Pantoprazole  Sodium; Take 1 tablet (40 mg total) by mouth daily.  Dispense: 90 tablet; Refill: 1    Follow-up:  As previously scheduled  Jacqulyn Ahle DO [facility] "

## 2024-08-24 NOTE — Assessment & Plan Note (Signed)
Uncontrolled. Starting Protonix.

## 2024-08-24 NOTE — Assessment & Plan Note (Signed)
 At goal.  Advised to continue statin at this time.  She has intermittent myalgias and cramping which I do not feel is due to statin.  Current guidelines do not recommend the use of co-Q10.

## 2024-09-03 ENCOUNTER — Ambulatory Visit

## 2024-09-03 VITALS — BP 128/80 | HR 65 | Resp 12 | Ht 63.0 in | Wt 200.0 lb

## 2024-09-03 DIAGNOSIS — Z0000 Encounter for general adult medical examination without abnormal findings: Secondary | ICD-10-CM

## 2024-09-03 DIAGNOSIS — Z0001 Encounter for general adult medical examination with abnormal findings: Secondary | ICD-10-CM

## 2024-09-03 DIAGNOSIS — F17210 Nicotine dependence, cigarettes, uncomplicated: Secondary | ICD-10-CM | POA: Diagnosis not present

## 2024-09-03 NOTE — Progress Notes (Signed)
 "  Chief Complaint  Patient presents with   Medicare Wellness     Subjective:   Latasha Lindsey is a 69 y.o. female who presents for a Medicare Annual Wellness Visit.  Visit info / Clinical Intake: Medicare Wellness Visit Type:: Initial Annual Wellness Visit Persons participating in visit and providing information:: patient Medicare Wellness Visit Mode:: In-person (required for WTM) Interpreter Needed?: No Pre-visit prep was completed: yes AWV questionnaire completed by patient prior to visit?: yes Date:: 08/30/24 Living arrangements:: (Patient-Rptd) lives with spouse/significant other Patient's Overall Health Status Rating: (Patient-Rptd) good Typical amount of pain: (Patient-Rptd) some Does pain affect daily life?: (Patient-Rptd) no Are you currently prescribed opioids?: no  Dietary Habits and Nutritional Risks How many meals a day?: (Patient-Rptd) 2 Eats fruit and vegetables daily?: (Patient-Rptd) yes Most meals are obtained by: (Patient-Rptd) preparing own meals In the last 2 weeks, have you had any of the following?: none Diabetic:: (!) yes Any non-healing wounds?: no How often do you check your BS?: 0 Would you like to be referred to a Nutritionist or for Diabetic Management? : no  Functional Status Activities of Daily Living (to include ambulation/medication): (Patient-Rptd) Independent Ambulation: (Patient-Rptd) Independent Medication Administration: (Patient-Rptd) Independent Home Management (perform basic housework or laundry): (Patient-Rptd) Independent Manage your own finances?: (Patient-Rptd) yes Primary transportation is: (Patient-Rptd) driving Concerns about vision?: no *vision screening is required for WTM* Concerns about hearing?: no  Fall Screening Falls in the past year?: (Patient-Rptd) 0 Number of falls in past year: 0 Was there an injury with Fall?: 0 Fall Risk Category Calculator: 0 Patient Fall Risk Level: Low Fall Risk  Fall Risk Patient at  Risk for Falls Due to: No Fall Risks Fall risk Follow up: Falls evaluation completed; Education provided; Falls prevention discussed  Home and Transportation Safety: All rugs have non-skid backing?: (Patient-Rptd) yes All stairs or steps have railings?: (Patient-Rptd) yes Grab bars in the bathtub or shower?: (!) (Patient-Rptd) no Have non-skid surface in bathtub or shower?: (Patient-Rptd) yes Good home lighting?: (Patient-Rptd) yes Regular seat belt use?: (Patient-Rptd) yes Hospital stays in the last year:: (Patient-Rptd) no  Cognitive Assessment Difficulty concentrating, remembering, or making decisions? : (Patient-Rptd) no Will 6CIT or Mini Cog be Completed: yes What year is it?: 0 points What month is it?: 0 points Give patient an address phrase to remember (5 components): [address] [place] About what time is it?: 0 points Count backwards from 20 to 1: 0 points Say the months of the year in reverse: 0 points Repeat the address phrase from earlier: 0 points 6 CIT Score: 0 points  Advance Directives (For Healthcare) Does Patient Have a Medical Advance Directive?: No Would patient like information on creating a medical advance directive?: No - Patient declined  Reviewed/Updated  Reviewed/Updated: Reviewed All (Medical, Surgical, Family, Medications, Allergies, Care Teams, Patient Goals)    Allergies (verified) Mucinex [guaifenesin er]   Current Medications (verified) Outpatient Encounter Medications as of 09/03/2024  Medication Sig   alendronate  (FOSAMAX ) 70 MG tablet Take 1 tablet (70 mg total) by mouth every 7 (seven) days. Take with a full glass of water on an empty stomach.   Cholecalciferol (VITAMIN D3) 1000 units CAPS Take 1,000 Units by mouth daily.   levothyroxine  (SYNTHROID ) 88 MCG tablet Take 1 tablet (88 mcg total) by mouth daily before breakfast.   metFORMIN  (GLUCOPHAGE -XR) 750 MG 24 hr tablet Take 1 tablet (750 mg total) by mouth 2 (two) times daily.    Multiple Vitamin (MULTIVITAMIN+ PO) Take  by mouth.   Omega-3 Fatty Acids (FISH OIL) 1200 MG CAPS Take 1,200 mg by mouth daily.   pantoprazole  (PROTONIX ) 40 MG tablet Take 1 tablet (40 mg total) by mouth daily.   rosuvastatin  (CRESTOR ) 10 MG tablet Take 1 tablet (10 mg total) by mouth daily. for cholesterol.   No facility-administered encounter medications on file as of 09/03/2024.    History: Past Medical History:  Diagnosis Date   Colon polyp    Dysrhythmia    palpitations   Hyperlipidemia    Hypothyroidism    Prediabetes    Thyroid  disease    Vertigo    Past Surgical History:  Procedure Laterality Date   ABDOMINAL HYSTERECTOMY     ACHILLES TENDON SURGERY Right 08/21/2017   Procedure: ACHILLES TENDON REPAIR;  Surgeon: Tobie Buckles, DPM;  Location: [facility];  Service: Podiatry;  Laterality: Right;   HEEL SPUR RESECTION Right 08/21/2017   Procedure: HEEL SPUR RESECTION - HAGLUND'S;  Surgeon: Tobie Buckles, DPM;  Location: [facility];  Service: Podiatry;  Laterality: Right;   Family History  Problem Relation Age of Onset   Heart disease Father    Hyperlipidemia Mother    Social History   Occupational History   Not on file  Tobacco Use   Smoking status: Former    Current packs/day: 0.00    Average packs/day: 1 pack/day for 30.0 years (30.0 ttl pk-yrs)    Types: Cigarettes    Start date: 10/14/1979    Quit date: 10/13/2009    Years since quitting: 14.9   Smokeless tobacco: Never  Vaping Use   Vaping status: Never Used  Substance and Sexual Activity   Alcohol use: Yes    Alcohol/week: 1.0 standard drink of alcohol    Types: 1 Standard drinks or equivalent per week   Drug use: No   Sexual activity: Yes    Birth control/protection: Surgical   Tobacco Counseling Counseling given: Yes  SDOH Screenings   Food Insecurity: No Food Insecurity (09/03/2024)  Housing: Low Risk (09/03/2024)  Transportation Needs: No Transportation Needs (09/03/2024)  Utilities: Not At Risk  (09/03/2024)  Alcohol Screen: Low Risk (06/12/2024)  Depression (PHQ2-9): Low Risk (09/03/2024)  Financial Resource Strain: Low Risk (06/12/2024)  Physical Activity: Sufficiently Active (09/03/2024)  Recent Concern: Physical Activity - Insufficiently Active (06/12/2024)  Social Connections: Moderately Isolated (09/03/2024)  Stress: No Stress Concern Present (09/03/2024)  Tobacco Use: Medium Risk (09/03/2024)  Health Literacy: Adequate Health Literacy (09/03/2024)   See flowsheets for full screening details  Depression Screen PHQ 2 & 9 Depression Scale- Over the past 2 weeks, how often have you been bothered by any of the following problems? Little interest or pleasure in doing things: 0 Feeling down, depressed, or hopeless (PHQ Adolescent also includes...irritable): 0 PHQ-2 Total Score: 0 Trouble falling or staying asleep, or sleeping too much: 0 Feeling tired or having little energy: 0 Poor appetite or overeating (PHQ Adolescent also includes...weight loss): 0 Feeling bad about yourself - or that you are a failure or have let yourself or your family down: 0 Trouble concentrating on things, such as reading the newspaper or watching television (PHQ Adolescent also includes...like school work): 0 Moving or speaking so slowly that other people could have noticed. Or the opposite - being so fidgety or restless that you have been moving around a lot more than usual: 0 Thoughts that you would be better off dead, or of hurting yourself in some way: 0 PHQ-9 Total Score: 0 If you checked off any  problems, how difficult have these problems made it for you to do your work, take care of things at home, or get along with other people?: Not difficult at all     Goals Addressed             This Visit's Progress    I want to increase my activity               Objective:    Today's Vitals   09/03/24 0818  BP: 128/80  Pulse: 65  Resp: 12  SpO2: 99%  Weight: 200 lb (90.7 kg)  Height: 5' 3  (1.6 m)   Body mass index is 35.43 kg/m.  Hearing/Vision screen Hearing Screening - Comments:: Patient denies any hearing difficulties.   Vision Screening - Comments:: Patient is not up to date on her eye exam. Does not have a regular eye doctor. List provided to patient  Immunizations and Health Maintenance Health Maintenance  Topic Date Due   Medicare Annual Wellness (AWV)  Never done   FOOT EXAM  05/16/2023   OPHTHALMOLOGY EXAM  05/01/2024   Diabetic kidney evaluation - Urine ACR  10/25/2024   HEMOGLOBIN A1C  10/25/2024   Lung Cancer Screening  11/06/2024   Diabetic kidney evaluation - eGFR measurement  04/27/2025   Mammogram  06/08/2025   Bone Density Scan  06/08/2026   Colonoscopy  10/28/2030   DTaP/Tdap/Td (3 - Td or Tdap) 05/15/2032   Pneumococcal Vaccine: 50+ Years  Completed   Influenza Vaccine  Completed   Hepatitis C Screening  Completed   Zoster Vaccines- Shingrix  Completed   Meningococcal B Vaccine  Aged Out   COVID-19 Vaccine  Discontinued        Assessment/Plan:  This is a routine wellness examination for Latasha Lindsey.  Patient Care Team: Cook, Jayce G, DO as PCP - General (Family Medicine)  I have personally reviewed and noted the following in the patients chart:   Medical and social history Use of alcohol, tobacco or illicit drugs  Current medications and supplements including opioid prescriptions. Functional ability and status Nutritional status Physical activity Advanced directives List of other physicians Hospitalizations, surgeries, and ER visits in previous 12 months Vitals Screenings to include cognitive, depression, and falls Referrals and appointments  Orders Placed This Encounter  Procedures   Ambulatory Referral for Lung Cancer Scre    Referral Priority:   Routine    Referral Type:   Consultation    Referral Reason:   Specialty Services Required    Number of Visits Requested:   1   In addition, I have reviewed and discussed with  patient certain preventive protocols, quality metrics, and best practice recommendations. A written personalized care plan for preventive services as well as general preventive health recommendations were provided to patient.   Samyah Bilbo, CMA   09/03/2024   Return September 09, 2025 at 8:00 am, for In office Medicare Well Visit w  Wellness Nurse.  After Visit Summary: (In Person-Declined) Patient declined AVS at this time.  "

## 2024-09-03 NOTE — Patient Instructions (Signed)
 Latasha Lindsey,  Thank you for taking the time for your Medicare Wellness Visit. I appreciate your continued commitment to your health goals. Please review the care plan we discussed, and feel free to reach out if I can assist you further.  Please note that Annual Wellness Visits do not include a physical exam. Some assessments may be limited, especially if the visit was conducted virtually. If needed, we may recommend an in-person follow-up with your provider.  Ongoing Care Seeing your primary care provider every 3 to 6 months helps us  monitor your health and provide consistent, personalized care.   Lung Cancer Screening-[place] Office [address]-First Floor Medical Building directly across from [facility] Phone Number:[phone]   Referrals If a referral was made during today's visit and you haven't received any updates within two weeks, please contact the referred provider directly to check on the status.  Recommended Screenings:  Health Maintenance  Topic Date Due   Medicare Annual Wellness Visit  Never done   Complete foot exam   05/16/2023   Eye exam for diabetics  05/01/2024   Kidney health urinalysis for diabetes  10/25/2024   Hemoglobin A1C  10/25/2024   Screening for Lung Cancer  11/06/2024   Yearly kidney function blood test for diabetes  04/27/2025   Breast Cancer Screening  06/08/2025   Osteoporosis screening with Bone Density Scan  06/08/2026   Colon Cancer Screening  10/28/2030   DTaP/Tdap/Td vaccine (3 - Td or Tdap) 05/15/2032   Pneumococcal Vaccine for age over 19  Completed   Flu Shot  Completed   Hepatitis C Screening  Completed   Zoster (Shingles) Vaccine  Completed   Meningitis B Vaccine  Aged Out   COVID-19 Vaccine  Discontinued       08/30/2024    9:40 AM  Advanced Directives  Does Patient Have a Medical Advance Directive? No  Would patient like information on creating a medical advance directive? No - Patient declined    Vision: Annual  vision screenings are recommended for early detection of glaucoma, cataracts, and diabetic retinopathy. These exams can also reveal signs of chronic conditions such as diabetes and high blood pressure.  Dental: Annual dental screenings help detect early signs of oral cancer, gum disease, and other conditions linked to overall health, including heart disease and diabetes.  Please see the attached documents for additional preventive care recommendations.

## 2024-09-14 ENCOUNTER — Telehealth: Payer: Self-pay

## 2024-09-14 NOTE — Telephone Encounter (Signed)
 Error

## 2024-10-28 ENCOUNTER — Ambulatory Visit: Admitting: Family Medicine

## 2025-09-09 ENCOUNTER — Ambulatory Visit
